# Patient Record
Sex: Female | Born: 1965
Health system: Southern US, Community
[De-identification: ages and names within clinical notes are randomized; demographics above are authoritative.]

## PROBLEM LIST (undated history)

## (undated) DIAGNOSIS — I10 Essential (primary) hypertension: Secondary | ICD-10-CM

## (undated) DIAGNOSIS — D869 Sarcoidosis, unspecified: Secondary | ICD-10-CM

## (undated) DIAGNOSIS — T7840XA Allergy, unspecified, initial encounter: Secondary | ICD-10-CM

## (undated) DIAGNOSIS — E039 Hypothyroidism, unspecified: Secondary | ICD-10-CM

## (undated) HISTORY — PX: KNEE ARTHROSCOPY: SUR90

## (undated) HISTORY — PX: THYROIDECTOMY: SHX17

## (undated) HISTORY — PX: ROTATOR CUFF REPAIR: SHX139

## (undated) HISTORY — PX: TONSILLECTOMY: SUR1361

---

## 1988-01-09 HISTORY — PX: TUBAL LIGATION: SHX77

## 2003-07-27 ENCOUNTER — Ambulatory Visit (HOSPITAL_COMMUNITY): Admission: RE | Admit: 2003-07-27 | Discharge: 2003-07-27 | Payer: Self-pay | Admitting: Family Medicine

## 2003-10-07 ENCOUNTER — Ambulatory Visit (HOSPITAL_COMMUNITY): Admission: RE | Admit: 2003-10-07 | Discharge: 2003-10-07 | Payer: Self-pay | Admitting: Endocrinology

## 2003-10-07 ENCOUNTER — Encounter (INDEPENDENT_AMBULATORY_CARE_PROVIDER_SITE_OTHER): Payer: Self-pay | Admitting: Specialist

## 2004-01-07 ENCOUNTER — Ambulatory Visit (HOSPITAL_COMMUNITY): Admission: RE | Admit: 2004-01-07 | Discharge: 2004-01-07 | Payer: Self-pay | Admitting: Endocrinology

## 2004-01-13 ENCOUNTER — Encounter: Admission: RE | Admit: 2004-01-13 | Discharge: 2004-04-12 | Payer: Self-pay | Admitting: Endocrinology

## 2004-03-14 ENCOUNTER — Observation Stay (HOSPITAL_COMMUNITY): Admission: RE | Admit: 2004-03-14 | Discharge: 2004-03-15 | Payer: Self-pay | Admitting: Surgery

## 2004-03-14 ENCOUNTER — Encounter (INDEPENDENT_AMBULATORY_CARE_PROVIDER_SITE_OTHER): Payer: Self-pay | Admitting: Specialist

## 2004-05-30 ENCOUNTER — Encounter: Admission: RE | Admit: 2004-05-30 | Discharge: 2004-08-28 | Payer: Self-pay | Admitting: Endocrinology

## 2005-04-05 ENCOUNTER — Ambulatory Visit: Payer: Self-pay | Admitting: Pulmonary Disease

## 2005-04-09 ENCOUNTER — Ambulatory Visit: Payer: Self-pay | Admitting: Pulmonary Disease

## 2005-04-20 ENCOUNTER — Ambulatory Visit: Payer: Self-pay | Admitting: Emergency Medicine

## 2005-05-21 ENCOUNTER — Ambulatory Visit: Payer: Self-pay | Admitting: Pulmonary Disease

## 2005-05-24 ENCOUNTER — Encounter (INDEPENDENT_AMBULATORY_CARE_PROVIDER_SITE_OTHER): Payer: Self-pay | Admitting: *Deleted

## 2005-05-24 ENCOUNTER — Ambulatory Visit: Payer: Self-pay | Admitting: Pulmonary Disease

## 2005-05-24 ENCOUNTER — Ambulatory Visit (HOSPITAL_COMMUNITY): Admission: RE | Admit: 2005-05-24 | Discharge: 2005-05-24 | Payer: Self-pay | Admitting: Pulmonary Disease

## 2005-06-19 ENCOUNTER — Ambulatory Visit: Payer: Self-pay | Admitting: Pulmonary Disease

## 2005-06-27 ENCOUNTER — Ambulatory Visit: Payer: Self-pay | Admitting: Pulmonary Disease

## 2005-07-09 ENCOUNTER — Ambulatory Visit (HOSPITAL_BASED_OUTPATIENT_CLINIC_OR_DEPARTMENT_OTHER): Admission: RE | Admit: 2005-07-09 | Discharge: 2005-07-09 | Payer: Self-pay | Admitting: Pulmonary Disease

## 2005-07-19 ENCOUNTER — Ambulatory Visit: Payer: Self-pay | Admitting: Pulmonary Disease

## 2005-07-26 ENCOUNTER — Ambulatory Visit: Payer: Self-pay | Admitting: Pulmonary Disease

## 2005-10-04 ENCOUNTER — Ambulatory Visit: Payer: Self-pay | Admitting: Pulmonary Disease

## 2005-10-05 ENCOUNTER — Ambulatory Visit: Payer: Self-pay | Admitting: Cardiology

## 2006-01-08 HISTORY — PX: OTHER SURGICAL HISTORY: SHX169

## 2006-03-20 ENCOUNTER — Ambulatory Visit: Payer: Self-pay | Admitting: Pulmonary Disease

## 2006-04-18 ENCOUNTER — Ambulatory Visit: Payer: Self-pay | Admitting: Pulmonary Disease

## 2006-05-16 ENCOUNTER — Ambulatory Visit: Payer: Self-pay | Admitting: Pulmonary Disease

## 2006-10-28 DIAGNOSIS — G4710 Hypersomnia, unspecified: Secondary | ICD-10-CM | POA: Insufficient documentation

## 2006-10-28 DIAGNOSIS — J45909 Unspecified asthma, uncomplicated: Secondary | ICD-10-CM | POA: Insufficient documentation

## 2010-02-12 ENCOUNTER — Emergency Department (INDEPENDENT_AMBULATORY_CARE_PROVIDER_SITE_OTHER): Payer: Worker's Compensation

## 2010-02-12 ENCOUNTER — Emergency Department (HOSPITAL_BASED_OUTPATIENT_CLINIC_OR_DEPARTMENT_OTHER)
Admission: EM | Admit: 2010-02-12 | Discharge: 2010-02-12 | Disposition: A | Discharge status: Home / Self Care | Payer: Worker's Compensation | Attending: Emergency Medicine | Admitting: Emergency Medicine

## 2010-02-12 DIAGNOSIS — M25519 Pain in unspecified shoulder: Secondary | ICD-10-CM

## 2010-02-12 DIAGNOSIS — Y9389 Activity, other specified: Secondary | ICD-10-CM

## 2010-02-12 DIAGNOSIS — M545 Low back pain, unspecified: Secondary | ICD-10-CM

## 2010-02-12 DIAGNOSIS — I10 Essential (primary) hypertension: Secondary | ICD-10-CM | POA: Insufficient documentation

## 2010-02-12 DIAGNOSIS — M5137 Other intervertebral disc degeneration, lumbosacral region: Secondary | ICD-10-CM | POA: Insufficient documentation

## 2010-02-12 DIAGNOSIS — M51379 Other intervertebral disc degeneration, lumbosacral region without mention of lumbar back pain or lower extremity pain: Secondary | ICD-10-CM | POA: Insufficient documentation

## 2010-02-12 DIAGNOSIS — Y9269 Other specified industrial and construction area as the place of occurrence of the external cause: Secondary | ICD-10-CM

## 2010-04-22 ENCOUNTER — Emergency Department (INDEPENDENT_AMBULATORY_CARE_PROVIDER_SITE_OTHER): Payer: BLUE CROSS/BLUE SHIELD

## 2010-04-22 ENCOUNTER — Emergency Department (HOSPITAL_BASED_OUTPATIENT_CLINIC_OR_DEPARTMENT_OTHER)
Admission: EM | Admit: 2010-04-22 | Discharge: 2010-04-22 | Disposition: A | Discharge status: Nursing Home (Includes ICF) | Payer: BLUE CROSS/BLUE SHIELD | Source: Home / Self Care | Attending: Emergency Medicine | Admitting: Emergency Medicine

## 2010-04-22 ENCOUNTER — Ambulatory Visit (HOSPITAL_COMMUNITY)
Admission: EM | Admit: 2010-04-22 | Discharge: 2010-04-22 | Disposition: A | Discharge status: Home / Self Care | Payer: BLUE CROSS/BLUE SHIELD | Attending: Emergency Medicine | Admitting: Emergency Medicine

## 2010-04-22 DIAGNOSIS — Z7902 Long term (current) use of antithrombotics/antiplatelets: Secondary | ICD-10-CM | POA: Insufficient documentation

## 2010-04-22 DIAGNOSIS — M65839 Other synovitis and tenosynovitis, unspecified forearm: Secondary | ICD-10-CM | POA: Insufficient documentation

## 2010-04-22 DIAGNOSIS — I10 Essential (primary) hypertension: Secondary | ICD-10-CM | POA: Insufficient documentation

## 2010-04-22 DIAGNOSIS — L02519 Cutaneous abscess of unspecified hand: Secondary | ICD-10-CM | POA: Insufficient documentation

## 2010-04-22 DIAGNOSIS — E7800 Pure hypercholesterolemia, unspecified: Secondary | ICD-10-CM | POA: Insufficient documentation

## 2010-04-22 DIAGNOSIS — Z79899 Other long term (current) drug therapy: Secondary | ICD-10-CM | POA: Insufficient documentation

## 2010-04-22 DIAGNOSIS — L03019 Cellulitis of unspecified finger: Secondary | ICD-10-CM | POA: Insufficient documentation

## 2010-04-22 DIAGNOSIS — J029 Acute pharyngitis, unspecified: Secondary | ICD-10-CM | POA: Insufficient documentation

## 2010-04-22 DIAGNOSIS — E119 Type 2 diabetes mellitus without complications: Secondary | ICD-10-CM | POA: Insufficient documentation

## 2010-04-22 LAB — DIFFERENTIAL
Basophils Relative: 0 % (ref 0–1)
Eosinophils Absolute: 0.2 10*3/uL (ref 0.0–0.7)
Eosinophils Relative: 2 % (ref 0–5)
Lymphocytes Relative: 32 % (ref 12–46)
Monocytes Absolute: 0.7 10*3/uL (ref 0.1–1.0)
Monocytes Relative: 11 % (ref 3–12)
Neutro Abs: 3.8 10*3/uL (ref 1.7–7.7)
Neutrophils Relative %: 55 % (ref 43–77)

## 2010-04-22 LAB — CBC
HCT: 37.6 % (ref 36.0–46.0)
Hemoglobin: 13 g/dL (ref 12.0–15.0)
MCH: 29.1 pg (ref 26.0–34.0)
MCHC: 34.6 g/dL (ref 30.0–36.0)
MCV: 84.1 fL (ref 78.0–100.0)
Platelets: 315 10*3/uL (ref 150–400)
RBC: 4.47 MIL/uL (ref 3.87–5.11)
RDW: 12.7 % (ref 11.5–15.5)
WBC: 6.9 10*3/uL (ref 4.0–10.5)

## 2010-04-22 LAB — BASIC METABOLIC PANEL
BUN: 10 mg/dL (ref 6–23)
Calcium: 9.1 mg/dL (ref 8.4–10.5)
Chloride: 100 mEq/L (ref 96–112)
Creatinine, Ser: 0.4 mg/dL (ref 0.4–1.2)
GFR calc Af Amer: >60 mL/min (ref >60)
GFR calc non Af Amer: >60 mL/min (ref >60)
Glucose, Bld: 132 mg/dL — ABNORMAL HIGH (ref 70–99)
Potassium: 3.8 mEq/L (ref 3.5–5.1)
Sodium: 137 mEq/L (ref 135–145)

## 2010-04-22 LAB — GLUCOSE, CAPILLARY: Glucose-Capillary: 108 mg/dL — ABNORMAL HIGH (ref 70–99)

## 2010-04-22 LAB — SEDIMENTATION RATE: Sed Rate: 20 mm/hr (ref 0–22)

## 2010-04-25 LAB — CULTURE, ROUTINE-ABSCESS

## 2010-04-27 NOTE — Op Note (Signed)
[name], SMITHHART NO.:  [id number]  MEDICAL RECORD NO.:  [id number]           PATIENT TYPE:  E  LOCATION:  [facility]                         FACILITY:  [facility]  PHYSICIAN:  Betha Loa, MD        DATE OF BIRTH:  March 23, 1965  DATE OF PROCEDURE:  04/22/2010 DATE OF DISCHARGE:                              OPERATIVE REPORT   PREOPERATIVE DIAGNOSIS:  Left ring finger abscess.  POSTOPERATIVE DIAGNOSIS:  Left ring finger abscess.  PROCEDURE:  Irrigation and debridement of left ring finger dorsal abscess.  SURGEON:  Betha Loa, MD  ASSISTANT:  None.  ANESTHESIA:  General.  IV FLUIDS:  Per Anesthesia flow sheet.  ESTIMATED BLOOD LOSS:  Minimal.  COMPLICATIONS:  None.  SPECIMENS:  Cultures to micro.  TOURNIQUET TIME:  18 minutes.  DISPOSITION:  Stable to PACU.  INDICATIONS:  [name] is a 45 year old right-hand dominant African American female who yesterday noted a pimple on the dorsum of the left ring finger.  She states this popped and a piece of hair came out.  She began to have swelling and erythema and pain in the finger.  She presented to [facility] where she was given a dose of IV clindamycin.  She was transferred to me for further care.  On evaluation, she had a swollen and erythematous dorsum of the left ring finger.  She was very tender to palpation.  There is a small amount of redness into the hand.  The remainder of the finger was not swollen. She was not tender at the distal phalanx or middle phalanx.  She was very mildly tender volarly over the proximal phalanx.  Most of her tenderness was dorsal.  There was no volar swelling or erythema.  I discussed with [name] the nature of infections and recommended we go to the operating room for irrigation and debridement of the infection. Risks, benefits, and alternatives of surgery were discussed including risk of blood loss, infection, damage to nerves, vessels, tendons, ligaments,  and bone, failure of surgery, need for additional procedures, complications with wound healing, continued infection, and potential need for repeat irrigation and debridement.  She voiced understanding these risks and elected to proceed.  OPERATIVE COURSE:  After being identified preoperatively by myself, the patient and I agreed upon procedure and site of procedure.  The surgical site was marked.  The risks, benefits, and alternatives of surgery were reviewed and she wished to proceed.  Surgical consent had been signed. She was transported to the operating room and placed on the operating room table in supine position with left upper extremity on arm board. General anesthesia was induced by the anesthesiologist.  The left upper extremity was prepped and draped in normal sterile orthopedic fashion. A surgical pause was performed between surgeons, Anesthesia, and operating room staff and all were in agreement as to patient, procedure, and site of procedure.  Tourniquet at proximal aspect of the extremity was inflated to 250 mmHg after exsanguination of limb by gravity. Incision was made in a curvilinear fashion at the dorsum of the left ring finger on the ulnar side.  This was carried into the subcutaneous tissues.  There was a small pocket of purulence.  This was cultured by swab and tissue culture and sent to microbiology lab.  The wound was copiously irrigated with sterile saline.  A small rent was made in the extensor tendon and there was no purulence from underneath the extensor tendon.  The area underneath the tendon was copiously irrigated with sterile saline as well.  A 1000 mL of sterile saline total was used. Remainder of the wound looked very good.  The wound was packed with 0.25- inch iodoform packing.  It was then dressed with sterile Xeroform and 4x4s and wrapped with Kling and a Coban dressing lightly.  The tourniquet was deflated at 18 minutes.  The operative drapes were  broken down.  The patient was awoken from anesthesia safely.  She was transferred back to stretcher and taken to PACU in stable condition.  I will give her Percocet 5/325 one to two p.o. q.6 h p.r.n. pain, dispensed #50 and Bactrim DS one p.o. b.i.d. x1 week.  I will see her back in the office within the week for postoperative follow up.     Betha Loa, MD     KK/MEDQ  D:  04/22/2010  T:  04/23/2010  Job:  [id number]  Electronically Signed by Betha Loa  on 04/27/2010 05:25:12 PM

## 2010-04-27 NOTE — H&P (Signed)
[name], [name] NO.:  [id number]  MEDICAL RECORD NO.:  [id number]           PATIENT TYPE:  E  LOCATION:  [facility]                         FACILITY:  [facility]  PHYSICIAN:  Betha Loa, MD        DATE OF BIRTH:  [birth year]  DATE OF ADMISSION:  04/22/2010 DATE OF DISCHARGE:                             HISTORY & PHYSICAL   CHIEF COMPLAINT:  Left ring finger infection.  HISTORY:  [name] is a 45 year old right-hand dominant African American female who noted a pimple on her left ring finger dorsally yesterday.  She states the pimple popped and there was a hair in it that she pulled out.  She then began to have swelling and erythema and pain in the finger.  She has had no fevers, chills, or night sweats.  She went to [facility] this morning where she was evaluated and referred to me for further care.  Her tetanus is up to date within the last 5 years.  She was given a dose of clindamycin 600 mg IV Ancef at [facility].  ALLERGIES:  ASPIRIN, CODEINE, and PENICILLIN causes hives.  PAST MEDICAL HISTORY: 1. Diabetes. 2. Hypertension. 3. Hypercholesterolemia.  PAST SURGICAL HISTORY:  Abdominal abscess, thyroid, tonsils, tubal ligation, and ruptured disk of her L-spine.  FAMILY HISTORY:  Noncontributory.  MEDICATIONS:  Januvia, lovastatin, metformin, and ramipril.  SOCIAL HISTORY:  Tobacco none.  Alcohol none.  REVIEW SYSTEMS:  A 13-point review of systems negative.  PHYSICAL EXAMINATION:  VITAL SIGNS:  Temperature 98.4, pulse 78, respirations 18, and BP 124/86. GENERAL:  Alert and oriented x3. HEENT:  Head is normocephalic and atraumatic. NECK:  Supple.  Full range of motion. CHEST:  Regular rate and rhythm. LUNGS:  Clear to auscultation bilaterally. ABDOMEN:  Nontender and nondistended. EXTREMITIES:  Bilateral upper extremity are distally neurovascularly intact in radial, median, and ulnar nerve distributions.  She has  intact sensation and capillary refill in all fingertips.  She can flex and extend the IP joints of her thumbs and cross her fingers.  Right upper extremity is without wounds and no tenderness to palpation.  Left upper extremity, she has a small punctate wound at the dorsal aspect of the ulnar side of the ring finger.  There is swelling and erythema surrounding this.  She is very tender to palpation throughout the finger, but mostly dorsally at the level of the proximal phalanx.  She is not tender volarly at the middle phalanx or distal phalanx.  She has mild tenderness volarly at the proximal phalanx PIP joint and MP joint. This is much less significant than at the dorsal aspect of the proximal phalanx.  There is erythema and swelling dorsally.  There is no erythema and no swelling volarly.  Extension of the finger causes pain dorsally but not volarly.  She is able to flex and extend the digit some.  RADIOGRAPHS:  AP and lateral views of the ring finger show no fractures, dislocations, or radiopaque foreign bodies.  LABORATORY RESULTS:  White blood count 6.9, hemoglobin 13.0, hematocrit 37.6, and platelets 315.  Sodium 137, potassium 3.8, chloride  100, carbon dioxide 23, glucose 132, BUN 10, creatinine 0.4, and calcium 9.1.  ASSESSMENT AND PLAN:  Left ring finger dorsal abscess.  I discussed with [name] the nature of this condition.  I recommended going to the operating room for irrigation and debridement of the left ring finger. We discussed the potential for dorsal and volar abscesses though I think this is a dorsal abscess only.  I do not think the flexor tendon is involved.  It is a difficult to determine because she is somewhat hypersensitive to tenderness but most of her tenderness is dorsal.  I discussed the risks, benefits, and alternative surgery including risk of blood loss, infection, damage to nerves, vessels, tendons, ligaments, and bone, failure of surgery, need for  additional surgery, complications with wound healing, and potential need for repeat irrigation and debridement.  She voiced understanding these risks and elected to proceed.  We will have the surgery arranged as soon as possible.     Betha Loa, MD     KK/MEDQ  D:  04/22/2010  T:  04/23/2010  Job:  [id number]  Electronically Signed by Betha Loa  on 04/27/2010 05:23:54 PM

## 2010-04-28 LAB — ANAEROBIC CULTURE

## 2010-05-23 NOTE — Assessment & Plan Note (Signed)
[facility]                             PULMONARY OFFICE NOTE   NAME:[name], [name]                     MRN:          [id number]  DATE:05/16/2006                            DOB:          1965-10-10    PULMONARY FOLLOWUP VISIT   I saw [name] in followup today for her hypersomnia with upper airway  resistance syndrome and asthma.   With regard to her hypersomnia, she had undergone an auto CPAP titration  study from April 22 to March 5.  Her 90th percentile pressure setting  was 12, although it appeared that the majority of her time was spent on  a pressure setting of 9 or less.  Her apnea/hypopnea index was 5.8 and  she had an average daily usage of about 2 hours.  She says, however,  that on the nights that she was able to use it more, she did notice a  symptomatic benefit the following day and seemed to sleep better that  night.  She was provided with a nasal mask with heated humidification  and chin strap.  Her main difficulty was that when she would open her  mouth, she felt like she could not catch her breath, but otherwise, she  seemed to be tolerating it reasonably well, considering she has only had  it for a short period of time.  With regard to her asthma, she is  complaining of nasal congestion with post-nasal drip in association with  chest tightness.  She says symptoms have improved to some degree, but  she is still having to use her ProAir HFA approximately once or twice  per day, which she says does help.   CURRENT MEDICATIONS:  1. Synthroid 88 mcg daily.  2. Metformin 500 mg b.i.d.  3. Symbicort 160/4.5 two puffs b.i.d.  4. Xyzal 5 mg as needed.  5. ProAir HFA 2 puffs as needed.   PHYSICAL EXAM:  She is 172 pounds.  Temperature 98.8, blood pressure  128/90, heart rate 68, oxygen saturation 98% on room air.  HEENT:  She has a clear nasal discharge with boggy nasal mucosa.  There  is no erythema to the posterior pharynx.   There is no lymphadenopathy.  HEART:  S1, S2.  CHEST:  No wheezing.  ABDOMEN:  Soft and nontender.  EXTREMITIES:  No edema.   IMPRESSION:  1. Hypersomnia with upper airway resistance syndrome and obstructive      sleep apnea.  I would continue her on continuous positive airway      pressure at 9 cm water with heated humidification, as she did gain      symptomatic benefit from this, particularly in light of the fact      that she does also have a history of diabetes.  I have also      reemphasized to her the importance of diet, exercise, and weight      reduction.  2. Asthma.  I will continue her on Symbicort 160/4.5 two puffs b.i.d.      as well as her Xyzal as needed.  I will have her restart the  use of      her Nasonex nasal spray and I have instructed her on the use of      nasal irrigation.  I will also start her on Singulair 10 mg      nightly.  If she is still having difficulty after this, then she      may need to undergo laboratory assessment to determine if she has      allergies, as well as to determine her IgE level.   I will follow up with her in approximately 6 to 8 weeks.     Coralyn Helling, MD  Electronically Signed    VS/MedQ  DD: 05/16/2006  DT: 05/16/2006  Job #: [id number]

## 2010-05-26 NOTE — Op Note (Signed)
NAME:  [name], [name] NO.:  [id number]   MEDICAL RECORD NO.:  [id number]          PATIENT TYPE:  AMB   LOCATION:  ENDO                         FACILITY:  [facility]   PHYSICIAN:  Coralyn Helling, M.D.      DATE OF BIRTH:  01-Dec-1965   DATE OF PROCEDURE:  05/24/2005  DATE OF DISCHARGE:                                 OPERATIVE REPORT   INDICATIONS FOR PROCEDURE:  This is a 45 year old female who presented with  dyspnea on exertion and was found to have multiple nodules in the right  middle lobe and right lower lobe.   PREOPERATIVE DIAGNOSIS:  Rule out lung cancer, rule out sarcoid, rule out  granulomatous infection.   POSTOPERATIVE DIAGNOSIS:  Rule out lung cancer, rule out sarcoid, rule out  granulomatous infection.   The patient was brought to the bronchoscopy suite and she was premedicated  with a total of 6 mg of Versed and 100 mcg of fentanyl IV, topical lidocaine  and Cetacaine spray were instilled into the right nares and posterior  pharynx for topical anesthesia.  The bronchoscope was entered through the  right nares.  The vocal cords visualized and appeared normal.  Topical onset  lidocaine was instilled as needed for anesthesia.  The bronchoscope was  advanced to the trachea.  The carina was visualized and appeared normal.  The mucosa was normal.  The left main bronchus was then entered.  The left  upper lingular and lower lobes were all visualized.  There is no obvious  endobronchial lesions and mucosa appeared normal.  The bronchoscope was then  entered into the right main bronchus.  The right upper, middle and lower  lobes were all visualized.  There is no obvious endobronchial lesions and  mucosa appeared normal.  A total of 100 mL of saline was instilled into the  right middle lobe with approximately 39 mL returned.  Bronchial brushing was  then done from the right middle lobe, after which transbronchial biopsies  were obtained from the right middle lobe  using fluoroscopic guidance.  There  was a minimal amount of bleeding.  Otherwise the patient tolerated the  procedure well.  The airways were inspected quickly and there was no obvious  signs of bleeding.  The bronchoscope was then withdrawn, then the patient  was returned recovery room in stable condition.  The samples will be sent  for cytology, pathology and microbiology with AFB fungal cultures, Gram  stain and culture. I will call the patient with results of the bronchoscopy  and follow up with her in my office in 1-2 weeks.      Coralyn Helling, M.D.  Electronically Signed     VS/MEDQ  D:  05/24/2005  T:  05/25/2005  Job:  [id number]

## 2010-05-26 NOTE — Procedures (Signed)
NAME:  [name], [name] NO.:  [id number]   MEDICAL RECORD NO.:  [id number]          PATIENT TYPE:  OUT   LOCATION:  SLEEP CENTER                 FACILITY:  [facility]   PHYSICIAN:  Coralyn Helling, M.D.      DATE OF BIRTH:  [birth year]   DATE OF STUDY:  07/09/2005                              NOCTURNAL POLYSOMNOGRAM   INDICATIONS FOR STUDY:  This is an individual who has symptoms of excessive  daytime sleepiness and snoring and is referred to the sleep lab for  evaluation of sleep disorder breathing.   Epworth Score is 14.   Medications are Glipizide, Levoxyl, Nasonex, albuterol and Zegrid.   SLEEP ARCHITECTURE:  Total recording time was 430.5 minutes. Total sleep  time was 304.5 minutes.  Sleepless efficiency was 71% which is reduced.  Sleep latency was 69.5 minutes which is prolonged. REM latency was 116.5  minutes. The patient appeared to have difficulty with sleep initiation due  to respiratory events.  The patient was observed in all stages of sleep,  although there was a slight reduction in the percentage of REM sleep to 14%  of the study and the patient was observed in both supine and non-supine  position.   RESPIRATORY DATA:  The respiratory disturbance index was 3.  The events were  exclusively obstructive in nature.  The average respiratory rate was 16.  Loud snoring was noted by the technician. Also of note is that the patient  appeared to have truncation of the pressure transducer associated with  arousals which would be suggestive of upper airway resistance syndrome.   OXYGEN DATA:  The oxygen saturation nadir was 90%. The baseline oxygenation  was 98%.  The patient spent the entire study with the oxygen saturation  between 90-100%.   CARDIAC DATA:  The average heart rate was 79.  An EKG showed normal sinus  rhythm.   MOVEMENT PARASOMNIA:  The periodic limb movement index was 5.5.   IMPRESSION:  The overall respiratory disturbance index for the study was  3  with an oxygen saturation nadir of 90%.  This does not qualify for a  diagnosis of obstructive sleep apnea.  However, it was noted that the  patient did have truncation of her air flow associated with arousals which  would be suggestive of upper airway resistance  syndrome.  If the patient is symptomatic with regards to this, then  consideration could be given to undergoing CPAP therapy to see if this  provides symptomatic relief.      Coralyn Helling, M.D.  Diplomat, Biomedical engineer of Sleep Medicine  Electronically Signed     VS/MEDQ  D:  07/20/2005 15:11:31  T:  07/20/2005 17:49:33  Job:  [id number]

## 2010-05-26 NOTE — Assessment & Plan Note (Signed)
[facility]                             PULMONARY OFFICE NOTE   NAME:[name], [name] VALTIERRA                     MRN:          [id number]  DATE:04/18/2006                            DOB:          May 11, 1965    I saw [name] in followup for her sleep disturbance with hypersomnia,  as well as her respiratory difficulties.   She was originally scheduled to have an auto CPAP titration study for 2  weeks, but apparently when the home care company came, they told her  that she needed to pay them 500 dollars that day, otherwise they would  not supply her with her equipment.  As a result, [name] was not able  to the titration study done.  She said that she did use the Ambien, and  this seemed to help her fall asleep, but I had explained to her that was  not the primary intent of the Ambien.  She says that she has also  developed worsening symptoms of cough, chest tightness, and wheezing.  She says she wakes up with the feeling that her throat is clogged.  She  also gets these difficulties with her breathing during the day.  She has  used her Pro-Air inhaler, and she said that this helps initially, but  then she gets her symptoms back again.  She said she is using a Pro-Air  inhaler 2 or 3 times a week at least, if not more often.  She is not  having much as far as sinus symptoms.  She is not having much as far as  reflux symptoms either.   Her medication list was reviewed.   PHYSICAL EXAMINATION:  She is 170 pounds.  Temperature is 98.3.  Blood  pressure is 114/88.  Heart rate of 75.  Oxygen saturation 98% on room  air.  HEENT:  There is no sinus tenderness.  No nasal discharge.  No oral  lesions.  No lymphadenopathy.  HEART:  S1 and S2.  CHEST:  There was faint wheezing heard at the bases.  ABDOMEN:  Obese, soft, non-tender.  EXTREMITIES:  No edema.   IMPRESSION:  1. Sleep disturbance with hypersomnia and findings of upper airway      resistance  syndrome on her overnight polysomnogram.  We will refer      her to a different home care company to see if she can be set up      for an auto CPAP titration study for 2 weeks to determine if she      does gain any symptomatic benefit from this.  She says that she has      also started an exercise program.  I have encouraged her to      continue with this with the hopes that if she is able to lose a      significant amount of weight, this could obviate the need for any      further interventions with her upper airway resistance syndrome.  2. Asthma without adequate control.  I have given her a sample and  prescription for Symbicort 160/4.5 two puffs b.i.d.  I have also      given her a sample and prescription for Pro-Air HFA, which she is      to use 2 puffs q.i.d. p.r.n.  I have also given her a sample and      prescription for Xyzal 5 mg once daily as needed.  I will have her      undergo a chest x-ray in my office.   I plan to follow up with her in approximately 4 weeks.  I have advised  her to call me if her symptoms do not improve with the above  interventions.     Coralyn Helling, MD  Electronically Signed    VS/MedQ  DD: 04/18/2006  DT: 04/18/2006  Job #: [id number]

## 2010-05-26 NOTE — Assessment & Plan Note (Signed)
[name]                               PULMONARY OFFICE NOTE   NAME:[name]                     MRN:          [id number]  DATE:07/26/2005                            DOB:          1965-04-05    I saw [name] in followup today for her dyspnea and abnormal chest x-ray.  She says that her symptoms of coughing and reflux have improved  significantly with her regime of nasal steroid with Nasonex, Zegrid and  p.r.n. albuterol.  She says she is using the albuterol about twice a week.  Additionally, her bronchoscopy biopsy results showed a slight prominence of  histocytes but then further staining was negative and culture results were  negative.  She had also undergone an overnight polysomnogram on June 09, 2005  which showed an apnea/hypopnea index of 3 and oxygen saturation of 90%, but  there was evidence for upper airway resistant syndrome.  She does not have  any other significant change in her health and medications since her last  visit.   PHYSICAL EXAMINATION:  GENERAL:  She is 173 pounds.  Temperature is 98.2.  Blood pressure is 120/83.  Heart rate is 88.  Oxygen saturation is 90% on  room air.  HEENT:  Pupils are reactive.  There is no sinus tenderness.  She had a  decreased erythema of the nasal mucosa as well as decreased erythema of the  posterior pharynx.  There is no lymphadenopathy, no thyromegaly.  HEART:  S1, S2.  CHEST:  No wheezing or rales.  ABDOMEN:  Soft, non-tender.  EXTREMITIES:  No edema.   IMPRESSION:  1.  Dyspnea with cough and abnormal computed tomography scan.  At this time      I will continue her on her regimen of  Nasonex , Zegerid and as needed      albuterol as she does seem to be gaining symptomatic benefit from this.      I have also discussed with her the importance of diet, exercise and      weight reduction to try to improve some of these symptoms as well.  2.  Upper airway insufficiency syndrome.  I  have discussed with her that      while this may contribute to her symptoms of sleep disruption and      daytime hypersomnolence - the correlation with adverse consequences is      less clear.  I reviewed several options with her for doing a __________      with this.  She says this time she would not wish to undergo any of      these, but would prefer to try doing a diet and exercise regimen at      first.  3.  Abnormal computerized tomography.  The bronchoscopy biopsy results were      essentially negative with the exception of the slight increase in the      amount of histiocytes seen on the surgical biopsy specimen - but again      the special stains were negative.  At this time I would  have her undergo      a repeat computerized tomography of her chest in approximately two      months to be certain that there is no progression of any ongoing      disease. I plan on following up with her after her computerized      tomography.                                   Coralyn Helling, MD   VS/MedQ  DD:  07/26/2005  DT:  07/26/2005  Job #:  [id number]

## 2010-05-26 NOTE — Op Note (Signed)
[name], [name]              ACCOUNT NO.:  [id number]   MEDICAL RECORD NO.:  [id number]          PATIENT TYPE:  AMB   LOCATION:  DAY                          FACILITY:  [facility]   PHYSICIAN:  Velora Heckler, MD      DATE OF BIRTH:  22-Apr-1965   DATE OF PROCEDURE:  03/14/2004  DATE OF DISCHARGE:                                 OPERATIVE REPORT   PREOPERATIVE DIAGNOSES:  1.  Thyroid goiter, follicular lesion right lobe.  2.  Hyperthyroidism.   POSTOPERATIVE DIAGNOSES:  1.  Thyroid goiter, follicular lesion right lobe.  2.  Hyperthyroidism.   PROCEDURE:  Total thyroidectomy.   SURGEON:  Velora Heckler, MD   ASSISTANT:  Gita Kudo, M.D.   ANESTHESIA:  General.   ESTIMATED BLOOD LOSS:  Minimal.   PREPARATION:  Betadine.   COMPLICATIONS:  None.   INDICATIONS:  The patient is a 45 year old black female, referred by Dr.  Dorisann Frames, with thyroid goiter with dominant right-sided nodule.  Fine-  needle aspiration showed this to be a follicular lesion.  The patient is  borderline hyperthyroid.  She now comes to surgery for resection.   BODY OF REPORT:  The procedure is done in OR #4 at the [facility].  The patient was brought to the operating room and placed in a  supine position on the operating room table.  Following administration of  general anesthesia, the patient is positioned and then prepped and draped in  the usual strict aseptic fashion.  After ascertaining that an adequate level  of anesthesia had been obtained, a Kocher incision is made with the #15  blade.  Dissection is carried through subcutaneous tissues and platysma.  Hemostasis is obtained with the electrocautery.  Skin flaps are developed  cephalad and caudad from the thyroid notch to the sternal notch.  A Mahorner  self-retaining retractor is placed for exposure.  Strap muscles are incised  in the midline.  Dissection was begun on the right side.  Strap muscles are  reflected  laterally.  The right lobe is markedly enlarged.  There is a very  large middle thyroid vein emptying immediately into the jugular vein.  This  is ligated in continuity with 3-0 silk ties and medium Ligaclips, then  divided.  The inferior pole was mobilized.  The superior pole was dissected  out.  The superior pole vessels are ligated in continuity with 2-0 silk ties  and medium Ligaclips, then divided.  The superior parathyroid gland was  identified and preserved.  Inferior venous tributaries are divided between  medium Ligaclips.  The gland is rolled anteriorly.  Branches of the inferior  thyroid artery are divided between small Ligaclips.  Recurrent laryngeal  nerve is identified and preserved.  Ligament of Allyson Sabal is transected using  the electrocautery for hemostasis.  The gland is rolled up and onto the  anterior surface of the trachea.   Next, we turned our attention to the left lobe. The left lobe is enlarged,  but not as significantly as the right side.  Again, strap muscles are  reflected laterally.  Again, there is a large middle thyroid vein emptying  immediately into the jugular vein.  This is, again, ligated in continuity  with 3-0 silk ties and medium Ligaclips, then divided.  The inferior venous  tributaries are ligated with 3-0 silk ties.  Superior pole was dissected  out.  Superior pole vessels are ligated in continuity with 2-0 silk ties and  medium Ligaclips, then divided.  The gland is rolled anteriorly.  Again,  parathyroid tissue was identified and preserved in the superior location.  Recurrent laryngeal nerve was identified and preserved.  Branches of the  inferior thyroid artery are divided between small Ligaclips.  Ligament of  Allyson Sabal is transected with the electrocautery; the gland is rolled onto the  anterior trachea.  It is excised off the anterior trachea using the  electrocautery for hemostasis.  Suture is used to mark the left superior  pole.  The entire  thyroid gland is submitted to pathology for review. Good  hemostasis is noted in both sides of the neck.  Surgicel was placed over the  area of the recurrent laryngeal nerves bilaterally.  Strap muscles are  reapproximated in the midline with interrupted 3-0 Vicryl sutures.  Platysma  is closed with interrupted 3-0 Vicryl sutures.  Skin is closed with a  running 4-0 Vicryl subcuticular suture.  The wound is washed and dried, and  Benzoin and Steri-Strips are applied.  Sterile dressings are applied.  The  patient is awakened from anesthesia and brought to the recovery room in  stable condition.  The patient tolerated the procedure well.      TMG/MEDQ  D:  03/14/2004  T:  03/14/2004  Job:  [id number]   cc:   Dorisann Frames, M.D.  [facility] [place] [address]  Fax: [phone]   Deatra James, M.D.  Fax: [phone]

## 2010-05-26 NOTE — Assessment & Plan Note (Signed)
[facility]                             PULMONARY OFFICE NOTE   NAME:[name], [name] BUZZELL                     MRN:          [id number]  DATE:03/20/2006                            DOB:          1965/11/01    I saw Ms. [name] today for evaluation of her difficulties with sleep.  She says that for the last 2 weeks she can not fall asleep or stay  asleep.  She says actually that she is able to fall asleep, but then she  will often times wake herself up with vivid dreams and then she is up  and down all night.  She says that she will have a lot of thoughts about  her difficulty falling asleep as well as how this will affect her during  the day.  She does look at the clock quite frequently.  She says that  she will fall asleep however sometimes while watching TV prior to going  to sleep.  She is trying to go to sleep between 10 and 11 and gets up at  about 6 in the morning but still feels quite tired, as well as having  these symptoms of tiredness persist through the day.  She has tried  taking Tylenol PM but did not notice any benefit from this.  She was  started on Synthroid at the beginning of February as well as being  started on Metformin at the beginning of February.   CURRENT MEDICATIONS:  1. Nasonex 2 sprays in each nostril p.r.n.  2. Synthroid 88 mcg daily.  3. Metformin 500 mg b.i.d.   PHYSICAL EXAMINATION:  She is 172 pounds, temperature is 98.5, blood  pressure is 120/74, heart rate is 82, oxygen saturation is 100%.  HEENT:  There is sinusitis, no nasal discharge, she has a Mallampati IV  airway, there is no lymphadenopathy.  HEART:  With S1, S2.  CHEST:  No wheezing or rales.  ABDOMEN:  Obese, soft, and tender.  EXTREMITIES:  There is no edema.   IMPRESSION:  1. Sleep disturbance with hypersomnia.  She did have the previous      sleep study which showed evidence for upper airway resistance      syndrome.  I again am concerned that she may  have some degree of      sleep disorder breathing which could be contributing to her      symptoms.  What I have asked her to do is to keep a sleep log for      the next few weeks.  I have also given her a prescription for      Ambien to see if his will help her to consolidate her sleep      pattern.  I have also made arrangements for her to have an auto      CPAP titration study for 2 weeks to determine if she can gain      symptomatic benefit from this, and if she does she may need to be      on CPAP therapy long term.  2. I have also discussed with  her rescheduling her followup computed      tomography scan of her chest to further evaluate the abnormalities      previously seen.   I will follow up with her in approximately 3 weeks.     Coralyn Helling, MD  Electronically Signed    VS/MedQ  DD: 03/20/2006  DT: 03/22/2006  Job #: [id number]

## 2010-08-13 ENCOUNTER — Encounter: Payer: Self-pay | Admitting: *Deleted

## 2010-08-13 ENCOUNTER — Emergency Department (HOSPITAL_BASED_OUTPATIENT_CLINIC_OR_DEPARTMENT_OTHER)
Admission: EM | Admit: 2010-08-13 | Discharge: 2010-08-13 | Disposition: A | Discharge status: Home / Self Care | Payer: BC Managed Care – PPO | Attending: Emergency Medicine | Admitting: Emergency Medicine

## 2010-08-13 ENCOUNTER — Emergency Department (INDEPENDENT_AMBULATORY_CARE_PROVIDER_SITE_OTHER): Payer: BC Managed Care – PPO

## 2010-08-13 DIAGNOSIS — S99929A Unspecified injury of unspecified foot, initial encounter: Secondary | ICD-10-CM

## 2010-08-13 DIAGNOSIS — IMO0002 Reserved for concepts with insufficient information to code with codable children: Secondary | ICD-10-CM

## 2010-08-13 DIAGNOSIS — S8990XA Unspecified injury of unspecified lower leg, initial encounter: Secondary | ICD-10-CM

## 2010-08-13 DIAGNOSIS — E119 Type 2 diabetes mellitus without complications: Secondary | ICD-10-CM | POA: Insufficient documentation

## 2010-08-13 DIAGNOSIS — Y92009 Unspecified place in unspecified non-institutional (private) residence as the place of occurrence of the external cause: Secondary | ICD-10-CM | POA: Insufficient documentation

## 2010-08-13 DIAGNOSIS — S9031XA Contusion of right foot, initial encounter: Secondary | ICD-10-CM

## 2010-08-13 DIAGNOSIS — W2209XA Striking against other stationary object, initial encounter: Secondary | ICD-10-CM | POA: Insufficient documentation

## 2010-08-13 DIAGNOSIS — S9030XA Contusion of unspecified foot, initial encounter: Secondary | ICD-10-CM | POA: Insufficient documentation

## 2010-08-13 MED ORDER — TRAMADOL HCL 50 MG PO TABS: 50.0000 mg | ORAL_TABLET | Freq: Four times a day (QID) | ORAL | Status: AC | PRN

## 2010-08-13 MED ORDER — TRAMADOL HCL 50 MG PO TABS
50.0000 mg | ORAL_TABLET | Freq: Once | ORAL | Status: AC
  Administered 2010-08-13: 50 mg via ORAL
  Filled 2010-08-13: qty 1

## 2010-08-13 NOTE — ED Provider Notes (Signed)
History     CSN: [id number] Arrival date & time: 08/13/2010  5:44 AM  Chief Complaint  Patient presents with  . Toe Injury   HPI Comments: Patient reports that last night while she was walking down the hallway and across the doorway that she hit her right foot on something. Since that time she's had a throbbing type pain. It woke her up from sleep this morning which is why she presents to emergency department now. The pain is located over her lateral right foot. She is able to bear weight on this foot.  The history is provided by the patient.    Past Medical History  Diagnosis Date  . Diabetes mellitus     Past Surgical History  Procedure Date  . Thyroidectomy     No family history on file.  History  Substance Use Topics  . Smoking status: Not on file  . Smokeless tobacco: Not on file  . Alcohol Use:     OB History    Grav Para Term Preterm Abortions TAB SAB Ect Mult Living                  Review of Systems  Constitutional: Negative.  Negative for fever and chills.  HENT: Negative.   Eyes: Negative.  Negative for discharge and redness.  Respiratory: Negative.  Negative for cough and shortness of breath.   Cardiovascular: Negative.  Negative for chest pain.  Gastrointestinal: Negative.  Negative for nausea, vomiting, abdominal pain and diarrhea.  Genitourinary: Negative.  Negative for dysuria and vaginal discharge.  Musculoskeletal: Negative.  Negative for back pain.  Skin: Negative.  Negative for color change and rash.  Neurological: Negative.  Negative for syncope and headaches.  Hematological: Negative.  Negative for adenopathy.  Psychiatric/Behavioral: Negative.  Negative for confusion.  All other systems reviewed and are negative.    Physical Exam  BP 144/81  Pulse 74  Temp 98.2 F (36.8 C)  Resp 18  SpO2 100%  Physical Exam  Constitutional: She is oriented to person, place, and time. She appears well-developed and well-nourished.  HENT:  Head:  Normocephalic and atraumatic.  Eyes: Conjunctivae and EOM are normal. Pupils are equal, round, and reactive to light.  Neck: Normal range of motion. Neck supple.  Cardiovascular: Normal rate.   Pulmonary/Chest: Effort normal.  Neurological: She is alert and oriented to person, place, and time.       Right foot has no swelling, erythema, warmth. She has some nonfocal tenderness over the lateral aspect of her foot over the fourth and fifth metatarsals and toes. She can flex and extend her ankle without difficulty. Capillary refill is less than 2 seconds. She has a palpable DP pulse.  Skin: Skin is warm and dry. No rash noted. No erythema.  Psychiatric: She has a normal mood and affect. Her behavior is normal. Judgment and thought content normal.    ED Course  Procedures  MDM  Dg Foot Complete Right  08/13/2010  *RADIOLOGY REPORT*  Clinical Data: Lateral foot pain, injury.  RIGHT FOOT COMPLETE - 3+ VIEW  Comparison: None.  Findings: No acute bony abnormality.  Specifically, no fracture, subluxation, or dislocation.  Soft tissues are intact.  IMPRESSION: No acute bony abnormality.  Original Report Authenticated By: Cyndie Chime, M.D.     Patient has no acute fractures noted on her x-ray. She is able to ambulate and bear weight on her foot with no signs of obvious infection. She has no lacerations or wounds.  Patient will be given instructions for pain medicine and ice and elevation as needed and followup with her primary care physician later this week.    Nat Christen, MD 08/13/10 [phone]

## 2010-11-26 ENCOUNTER — Encounter (HOSPITAL_BASED_OUTPATIENT_CLINIC_OR_DEPARTMENT_OTHER): Payer: Self-pay | Admitting: Emergency Medicine

## 2010-11-26 ENCOUNTER — Emergency Department (HOSPITAL_BASED_OUTPATIENT_CLINIC_OR_DEPARTMENT_OTHER)
Admission: EM | Admit: 2010-11-26 | Discharge: 2010-11-26 | Disposition: A | Discharge status: Home / Self Care | Payer: BC Managed Care – PPO | Attending: Emergency Medicine | Admitting: Emergency Medicine

## 2010-11-26 DIAGNOSIS — I10 Essential (primary) hypertension: Secondary | ICD-10-CM | POA: Insufficient documentation

## 2010-11-26 DIAGNOSIS — J029 Acute pharyngitis, unspecified: Secondary | ICD-10-CM

## 2010-11-26 DIAGNOSIS — E119 Type 2 diabetes mellitus without complications: Secondary | ICD-10-CM | POA: Insufficient documentation

## 2010-11-26 HISTORY — DX: Essential (primary) hypertension: I10

## 2010-11-26 LAB — RAPID STREP SCREEN (MED CTR MEBANE ONLY): Streptococcus, Group A Screen (Direct): NEGATIVE

## 2010-11-26 MED ORDER — AZITHROMYCIN 250 MG PO TABS: 250.0000 mg | ORAL_TABLET | Freq: Every day | ORAL | Status: AC

## 2010-11-26 MED ORDER — OXYCODONE-ACETAMINOPHEN 5-325 MG PO TABS: 1.0000 | ORAL_TABLET | ORAL | Status: AC | PRN

## 2010-11-26 MED ORDER — OXYCODONE-ACETAMINOPHEN 5-325 MG PO TABS
1.0000 | ORAL_TABLET | Freq: Once | ORAL | Status: AC
  Administered 2010-11-26: 1 via ORAL
  Filled 2010-11-26: qty 1

## 2010-11-26 NOTE — ED Provider Notes (Signed)
History     CSN: [id number] Arrival date & time: 11/26/2010 11:09 AM   First MD Initiated Contact with Patient 11/26/10 1139      Chief Complaint  Patient presents with  . Sore Throat    (Consider location/radiation/quality/duration/timing/severity/associated sxs/prior treatment) HPI Patient with sore throat for two days.  No known fever.  Pain is burning and worsens with swallowing.  Pain is worsening.  Patient with some cough and chest wall pain with coughing.  No rhinnorhea.  Some diarrhea, no vomiting.  BS is in the 200.     Past Medical History  Diagnosis Date  . Diabetes mellitus   . Hypertension     Past Surgical History  Procedure Date  . Thyroidectomy     No family history on file.  History  Substance Use Topics  . Smoking status: Never Smoker   . Smokeless tobacco: Not on file  . Alcohol Use: No    OB History    Grav Para Term Preterm Abortions TAB SAB Ect Mult Living                  Review of Systems  All other systems reviewed and are negative.    Allergies  Aspirin; Codeine; and Penicillins  Home Medications   Current Outpatient Rx  Name Route Sig Dispense Refill  . SITAGLIPTIN PHOSPHATE 25 MG PO TABS Oral Take 25 mg by mouth daily.      Marland Kitchen LEVOTHYROXINE SODIUM 100 MCG PO TABS Oral Take 100 mcg by mouth daily.      Marland Kitchen METFORMIN HCL ER (MOD) 1000 MG PO TB24 Oral Take 1,000 mg by mouth daily with breakfast.      . SIMVASTATIN 10 MG PO TABS Oral Take 10 mg by mouth at bedtime.        BP 142/89  Pulse 80  Temp(Src) 98.1 F (36.7 C) (Oral)  Resp 18  SpO2 100%  Physical Exam  Vitals reviewed. Constitutional: She appears well-developed and well-nourished.  HENT:  Head: Normocephalic and atraumatic.  Right Ear: External ear normal.  Left Ear: External ear normal.  Nose: Nose normal.  Mouth/Throat: Oropharynx is clear and moist.  Eyes: Conjunctivae and EOM are normal. Pupils are equal, round, and reactive to light.  Neck: Normal range  of motion. Neck supple.  Cardiovascular: Normal rate.   Pulmonary/Chest: Effort normal and breath sounds normal.  Abdominal: Soft. Bowel sounds are normal.  Musculoskeletal: Normal range of motion.  Lymphadenopathy:    She has cervical adenopathy.  Neurological: She is alert. She has normal reflexes.  Skin: Skin is warm and dry.  Psychiatric: She has a normal mood and affect.    ED Course  Procedures (including critical care time)  Labs Reviewed - No data to display No results found.   No diagnosis found.    MDM          Hilario Quarry, MD 11/26/10 [phone]

## 2010-11-26 NOTE — ED Notes (Signed)
Pt c/o sore throat & cough since Friday afternoon.

## 2012-11-13 ENCOUNTER — Encounter (HOSPITAL_BASED_OUTPATIENT_CLINIC_OR_DEPARTMENT_OTHER): Payer: Self-pay | Admitting: Emergency Medicine

## 2012-11-13 ENCOUNTER — Emergency Department (HOSPITAL_BASED_OUTPATIENT_CLINIC_OR_DEPARTMENT_OTHER)
Admission: EM | Admit: 2012-11-13 | Discharge: 2012-11-13 | Disposition: A | Discharge status: Home / Self Care | Payer: BC Managed Care – PPO | Attending: Emergency Medicine | Admitting: Emergency Medicine

## 2012-11-13 DIAGNOSIS — Z79899 Other long term (current) drug therapy: Secondary | ICD-10-CM | POA: Insufficient documentation

## 2012-11-13 DIAGNOSIS — Z880 Allergy status to penicillin: Secondary | ICD-10-CM | POA: Insufficient documentation

## 2012-11-13 DIAGNOSIS — T7840XA Allergy, unspecified, initial encounter: Secondary | ICD-10-CM

## 2012-11-13 DIAGNOSIS — R21 Rash and other nonspecific skin eruption: Secondary | ICD-10-CM | POA: Insufficient documentation

## 2012-11-13 DIAGNOSIS — E119 Type 2 diabetes mellitus without complications: Secondary | ICD-10-CM | POA: Insufficient documentation

## 2012-11-13 DIAGNOSIS — I10 Essential (primary) hypertension: Secondary | ICD-10-CM | POA: Insufficient documentation

## 2012-11-13 DIAGNOSIS — L299 Pruritus, unspecified: Secondary | ICD-10-CM | POA: Insufficient documentation

## 2012-11-13 DIAGNOSIS — R220 Localized swelling, mass and lump, head: Secondary | ICD-10-CM | POA: Insufficient documentation

## 2012-11-13 MED ORDER — FAMOTIDINE IN NACL 20-0.9 MG/50ML-% IV SOLN
20.0000 mg | Freq: Once | INTRAVENOUS | Status: AC
  Administered 2012-11-13: 20 mg via INTRAVENOUS
  Filled 2012-11-13: qty 50

## 2012-11-13 MED ORDER — HYDROXYZINE HCL 25 MG PO TABS: 25.0000 mg | ORAL_TABLET | Freq: Four times a day (QID) | ORAL | Status: DC

## 2012-11-13 MED ORDER — DIPHENHYDRAMINE HCL 50 MG/ML IJ SOLN
25.0000 mg | Freq: Once | INTRAMUSCULAR | Status: AC
  Administered 2012-11-13: 25 mg via INTRAVENOUS
  Filled 2012-11-13: qty 1

## 2012-11-13 MED ORDER — METHYLPREDNISOLONE SODIUM SUCC 125 MG IJ SOLR
125.0000 mg | Freq: Once | INTRAMUSCULAR | Status: AC
  Administered 2012-11-13: 125 mg via INTRAVENOUS
  Filled 2012-11-13: qty 2

## 2012-11-13 MED ORDER — FAMOTIDINE 20 MG PO TABS: 20.0000 mg | ORAL_TABLET | Freq: Two times a day (BID) | ORAL | Status: DC

## 2012-11-13 NOTE — ED Notes (Signed)
Pt now states having some chest tightness,  Onset last pm when face started swelling

## 2012-11-13 NOTE — ED Notes (Signed)
C/o facial swelling , lip and eyes swelling onset yesterday after eating 2 peanuts,  States las pm has some diff breathing,  At present no diff breathing or talking,  Swelling has gone down some

## 2012-11-13 NOTE — ED Provider Notes (Signed)
CSN: [id number]     Arrival date & time 11/13/12  1140 History   First MD Initiated Contact with Patient 11/13/12 1217     Chief Complaint  Patient presents with  . Facial Swelling   (Consider location/radiation/quality/duration/timing/severity/associated sxs/prior Treatment) Patient is a 47 y.o. female presenting with allergic reaction. The history is provided by the patient. No language interpreter was used.  Allergic Reaction Presenting symptoms: itching, rash and swelling   Presenting symptoms: no difficulty breathing   Rash:    Location:  Full body   Quality: itchiness     Severity:  Mild   Onset quality:  Sudden   Timing:  Constant   Progression:  Resolved Severity:  Moderate Prior allergic episodes:  No prior episodes Relieved by:  Nothing Worsened by:  Nothing tried Ineffective treatments:  None tried   Past Medical History  Diagnosis Date  . Diabetes mellitus   . Hypertension    Past Surgical History  Procedure Laterality Date  . Thyroidectomy     History reviewed. No pertinent family history. History  Substance Use Topics  . Smoking status: Never Smoker   . Smokeless tobacco: Not on file  . Alcohol Use: No   OB History   Grav Para Term Preterm Abortions TAB SAB Ect Mult Living                 Review of Systems  Skin: Positive for itching and rash.  All other systems reviewed and are negative.    Allergies  Aspirin; Codeine; and Penicillins  Home Medications   Current Outpatient Rx  Name  Route  Sig  Dispense  Refill  . famotidine (PEPCID) 20 MG tablet   Oral   Take 1 tablet (20 mg total) by mouth 2 (two) times daily.   10 tablet   0   . hydrOXYzine (ATARAX/VISTARIL) 25 MG tablet   Oral   Take 1 tablet (25 mg total) by mouth every 6 (six) hours.   12 tablet   0   . levothyroxine (SYNTHROID, LEVOTHROID) 100 MCG tablet   Oral   Take 100 mcg by mouth daily.           . metFORMIN (GLUMETZA) 1000 MG (MOD) 24 hr tablet   Oral   Take  1,000 mg by mouth daily with breakfast.           . simvastatin (ZOCOR) 10 MG tablet   Oral   Take 10 mg by mouth at bedtime.           . sitaGLIPtin (JANUVIA) 25 MG tablet   Oral   Take 25 mg by mouth daily.            BP 126/79  Pulse 76  Temp(Src) 98.2 F (36.8 C) (Oral)  Resp 18  Ht 5\' 4"  (1.626 m)  Wt 162 lb (73.483 kg)  BMI 27.79 kg/m2  SpO2 100% Physical Exam  Nursing note and vitals reviewed. Constitutional: She is oriented to person, place, and time. She appears well-developed and well-nourished.  HENT:  Head: Normocephalic and atraumatic.  Right Ear: External ear normal.  Left Ear: External ear normal.  Nose: Nose normal.  Mouth/Throat: Oropharynx is clear and moist.  Eyes: Pupils are equal, round, and reactive to light.  Neck: Normal range of motion. Neck supple.  Cardiovascular: Normal rate, regular rhythm and normal heart sounds.   Pulmonary/Chest: Effort normal and breath sounds normal.  Abdominal: Soft.  Musculoskeletal: Normal range of motion.  Neurological: She  is alert and oriented to person, place, and time. She has normal reflexes.  Skin: Skin is warm.  Psychiatric: She has a normal mood and affect.    ED Course  Procedures (including critical care time) Labs Review Labs Reviewed - No data to display Imaging Review No results found.  EKG Interpretation   None      Date: 11/13/2012  Rate: 73  Rhythm: normal sinus rhythm  QRS Axis: normal  Intervals: normal  ST/T Wave abnormalities: normal  Conduction Disutrbances:none  Narrative Interpretation:   Old EKG Reviewed: none available   MDM Pt feels better after solumedrol, pepcid and benadryl.   Decreased swelling   1. Allergic reaction, initial encounter        Elson Areas, PA-C 11/13/12 1615  Lonia Skinner [place], PA-C 11/13/12 1615  Lonia Skinner [place], [place] 11/13/12 [phone]

## 2012-11-14 NOTE — ED Provider Notes (Signed)
Medical screening examination/treatment/procedure(s) were performed by non-physician practitioner and as supervising physician I was immediately available for consultation/collaboration.  EKG Interpretation     Ventricular Rate:  73 PR Interval:  170 QRS Duration: 92 QT Interval:  376 QTC Calculation: 414 R Axis:   -29 Text Interpretation:  Normal sinus rhythm Septal infarct , age undetermined Abnormal ECG              Candyce Churn, MD 11/14/12 [phone]

## 2012-12-09 ENCOUNTER — Encounter (HOSPITAL_BASED_OUTPATIENT_CLINIC_OR_DEPARTMENT_OTHER): Payer: Self-pay | Admitting: Emergency Medicine

## 2012-12-09 ENCOUNTER — Emergency Department (HOSPITAL_BASED_OUTPATIENT_CLINIC_OR_DEPARTMENT_OTHER)
Admission: EM | Admit: 2012-12-09 | Discharge: 2012-12-09 | Disposition: A | Discharge status: Home / Self Care | Payer: BC Managed Care – PPO | Attending: Emergency Medicine | Admitting: Emergency Medicine

## 2012-12-09 DIAGNOSIS — E119 Type 2 diabetes mellitus without complications: Secondary | ICD-10-CM | POA: Insufficient documentation

## 2012-12-09 DIAGNOSIS — T7840XA Allergy, unspecified, initial encounter: Secondary | ICD-10-CM

## 2012-12-09 DIAGNOSIS — Z880 Allergy status to penicillin: Secondary | ICD-10-CM | POA: Insufficient documentation

## 2012-12-09 DIAGNOSIS — L500 Allergic urticaria: Secondary | ICD-10-CM | POA: Insufficient documentation

## 2012-12-09 DIAGNOSIS — R220 Localized swelling, mass and lump, head: Secondary | ICD-10-CM | POA: Insufficient documentation

## 2012-12-09 DIAGNOSIS — I10 Essential (primary) hypertension: Secondary | ICD-10-CM | POA: Insufficient documentation

## 2012-12-09 DIAGNOSIS — Z79899 Other long term (current) drug therapy: Secondary | ICD-10-CM | POA: Insufficient documentation

## 2012-12-09 HISTORY — DX: Allergy, unspecified, initial encounter: T78.40XA

## 2012-12-09 LAB — GLUCOSE, CAPILLARY: Glucose-Capillary: 106 mg/dL — ABNORMAL HIGH (ref 70–99)

## 2012-12-09 MED ORDER — PREDNISONE 20 MG PO TABS: 60.0000 mg | ORAL_TABLET | Freq: Every day | ORAL | Status: DC

## 2012-12-09 MED ORDER — FAMOTIDINE 20 MG PO TABS: 20.0000 mg | ORAL_TABLET | Freq: Every day | ORAL | Status: DC

## 2012-12-09 MED ORDER — METHYLPREDNISOLONE SODIUM SUCC 125 MG IJ SOLR
125.0000 mg | Freq: Once | INTRAMUSCULAR | Status: AC
  Administered 2012-12-09: 125 mg via INTRAVENOUS
  Filled 2012-12-09: qty 2

## 2012-12-09 MED ORDER — FAMOTIDINE IN NACL 20-0.9 MG/50ML-% IV SOLN
20.0000 mg | Freq: Once | INTRAVENOUS | Status: AC
  Administered 2012-12-09: 20 mg via INTRAVENOUS
  Filled 2012-12-09: qty 50

## 2012-12-09 MED ORDER — DIPHENHYDRAMINE HCL 50 MG/ML IJ SOLN
50.0000 mg | Freq: Once | INTRAMUSCULAR | Status: AC
  Administered 2012-12-09: 50 mg via INTRAVENOUS
  Filled 2012-12-09: qty 1

## 2012-12-09 NOTE — ED Notes (Signed)
Pt states she started a cleanse yesterday with fruit shakes, states she made it herself with spinach, apples and mangos. Also ate peanuts, but has eaten peanuts in the past with no problems. Pt denies any other changes recently. md at bedside for exam.

## 2012-12-09 NOTE — ED Notes (Signed)
Pt assisted to call her daughter for ride home. Daughter states she is on [address] eating breakfast and will be here shortly. Pt states "I'm so sleepy..." denies any other c/o or needs.

## 2012-12-09 NOTE — ED Notes (Signed)
Pt states that either her husband or her daughter will pick her up and drive her home prior to administration of diphenhydramine. Pt states "I just live right up the road..." explained to pt that due to the side effects of diphenhydramine she will have to have someone drive her home. Pt verbalizes understanding.

## 2012-12-09 NOTE — ED Provider Notes (Signed)
CSN: [id number]     Arrival date & time 12/09/12  0708 History   First MD Initiated Contact with Patient 12/09/12 [phone]     Chief Complaint  Patient presents with  . Pruritis  . Facial Swelling   (Consider location/radiation/quality/duration/timing/severity/associated sxs/prior Treatment) HPI Pt with history of DM and HTN reports she woke up this morning with diffuse hives and swollen lips. Hives are itchy, on arms legs and truck. Denies tongue or throat swelling. No difficulty breathing. She had a similar reaction about a month ago. No definite allergen then but she had eaten some peanuts shortly prior. She has been eating peanuts since then with no reaction however. She reports she started a 'cleanse' yesterday with a fruit smoothie but no new foods or meds.   Past Medical History  Diagnosis Date  . Diabetes mellitus   . Hypertension   . Allergic reaction    Past Surgical History  Procedure Laterality Date  . Thyroidectomy     History reviewed. No pertinent family history. History  Substance Use Topics  . Smoking status: Never Smoker   . Smokeless tobacco: Not on file  . Alcohol Use: No   OB History   Grav Para Term Preterm Abortions TAB SAB Ect Mult Living                 Review of Systems All other systems reviewed and are negative except as noted in HPI.   Allergies  Aspirin; Codeine; and Penicillins  Home Medications   Current Outpatient Rx  Name  Route  Sig  Dispense  Refill  . Liraglutide (VICTOZA [place])   Subcutaneous   Inject into the skin.         Marland Kitchen losartan (COZAAR) 50 MG tablet   Oral   Take 50 mg by mouth daily.         . famotidine (PEPCID) 20 MG tablet   Oral   Take 1 tablet (20 mg total) by mouth 2 (two) times daily.   10 tablet   0   . hydrOXYzine (ATARAX/VISTARIL) 25 MG tablet   Oral   Take 1 tablet (25 mg total) by mouth every 6 (six) hours.   12 tablet   0   . levothyroxine (SYNTHROID, LEVOTHROID) 100 MCG tablet   Oral   Take 100  mcg by mouth daily.           . metFORMIN (GLUMETZA) 1000 MG (MOD) 24 hr tablet   Oral   Take 1,000 mg by mouth daily with breakfast.           . simvastatin (ZOCOR) 10 MG tablet   Oral   Take 10 mg by mouth at bedtime.           . sitaGLIPtin (JANUVIA) 25 MG tablet   Oral   Take 25 mg by mouth daily.            BP 133/79  Pulse 85  Temp(Src) 99 F (37.2 C) (Oral)  Resp 18  Ht 5\' 4"  (1.626 m)  Wt 162 lb (73.483 kg)  BMI 27.79 kg/m2  SpO2 100% Physical Exam  Nursing note and vitals reviewed. Constitutional: She is oriented to person, place, and time. She appears well-developed and well-nourished.  HENT:  Head: Normocephalic and atraumatic.  Lips swollen  Eyes: EOM are normal. Pupils are equal, round, and reactive to light.  Neck: Normal range of motion. Neck supple.  Cardiovascular: Normal rate, normal heart sounds and intact distal pulses.  Pulmonary/Chest: Effort normal and breath sounds normal. No stridor.  Abdominal: Bowel sounds are normal. She exhibits no distension. There is no tenderness.  Musculoskeletal: Normal range of motion. She exhibits no edema and no tenderness.  Neurological: She is alert and oriented to person, place, and time. She has normal strength. No cranial nerve deficit or sensory deficit.  Skin: Skin is warm and dry. Rash (diffuse urticaria rash, mild excoriation) noted.  Psychiatric: She has a normal mood and affect.    ED Course  Procedures (including critical care time) Labs Review Labs Reviewed - No data to display Imaging Review No results found.  EKG Interpretation   None       MDM   1. Allergic reaction, initial encounter     Pt feeling better, rash and facial swelling improved. Will continue to observe for rebound reaction. Anticipate she will need referral to an allergist for further evaluation.    Charles B. Bernette Mayers, MD 12/09/12 [phone]

## 2013-04-11 ENCOUNTER — Emergency Department (HOSPITAL_BASED_OUTPATIENT_CLINIC_OR_DEPARTMENT_OTHER): Payer: BC Managed Care – PPO

## 2013-04-11 ENCOUNTER — Encounter (HOSPITAL_BASED_OUTPATIENT_CLINIC_OR_DEPARTMENT_OTHER): Payer: Self-pay | Admitting: Emergency Medicine

## 2013-04-11 ENCOUNTER — Emergency Department (HOSPITAL_BASED_OUTPATIENT_CLINIC_OR_DEPARTMENT_OTHER)
Admission: EM | Admit: 2013-04-11 | Discharge: 2013-04-11 | Disposition: A | Discharge status: Home / Self Care | Payer: BC Managed Care – PPO | Attending: Emergency Medicine | Admitting: Emergency Medicine

## 2013-04-11 DIAGNOSIS — Z880 Allergy status to penicillin: Secondary | ICD-10-CM | POA: Insufficient documentation

## 2013-04-11 DIAGNOSIS — T4995XA Adverse effect of unspecified topical agent, initial encounter: Secondary | ICD-10-CM | POA: Insufficient documentation

## 2013-04-11 DIAGNOSIS — E119 Type 2 diabetes mellitus without complications: Secondary | ICD-10-CM | POA: Insufficient documentation

## 2013-04-11 DIAGNOSIS — I10 Essential (primary) hypertension: Secondary | ICD-10-CM | POA: Insufficient documentation

## 2013-04-11 DIAGNOSIS — R599 Enlarged lymph nodes, unspecified: Secondary | ICD-10-CM

## 2013-04-11 DIAGNOSIS — J3489 Other specified disorders of nose and nasal sinuses: Secondary | ICD-10-CM | POA: Insufficient documentation

## 2013-04-11 DIAGNOSIS — IMO0002 Reserved for concepts with insufficient information to code with codable children: Secondary | ICD-10-CM | POA: Insufficient documentation

## 2013-04-11 DIAGNOSIS — T7840XA Allergy, unspecified, initial encounter: Secondary | ICD-10-CM

## 2013-04-11 DIAGNOSIS — R0789 Other chest pain: Secondary | ICD-10-CM

## 2013-04-11 DIAGNOSIS — R51 Headache: Secondary | ICD-10-CM | POA: Insufficient documentation

## 2013-04-11 DIAGNOSIS — R519 Headache, unspecified: Secondary | ICD-10-CM

## 2013-04-11 DIAGNOSIS — R071 Chest pain on breathing: Secondary | ICD-10-CM | POA: Insufficient documentation

## 2013-04-11 DIAGNOSIS — Z79899 Other long term (current) drug therapy: Secondary | ICD-10-CM | POA: Insufficient documentation

## 2013-04-11 DIAGNOSIS — R591 Generalized enlarged lymph nodes: Secondary | ICD-10-CM

## 2013-04-11 DIAGNOSIS — R0981 Nasal congestion: Secondary | ICD-10-CM

## 2013-04-11 DIAGNOSIS — J029 Acute pharyngitis, unspecified: Secondary | ICD-10-CM | POA: Insufficient documentation

## 2013-04-11 DIAGNOSIS — L299 Pruritus, unspecified: Secondary | ICD-10-CM | POA: Insufficient documentation

## 2013-04-11 LAB — CBC WITH DIFFERENTIAL/PLATELET
Basophils Absolute: 0 10*3/uL (ref 0.0–0.1)
Basophils Relative: 1 % (ref 0–1)
EOS ABS: 0.1 10*3/uL (ref 0.0–0.7)
EOS PCT: 2 % (ref 0–5)
HEMATOCRIT: 38.2 % (ref 36.0–46.0)
HEMOGLOBIN: 13.3 g/dL (ref 12.0–15.0)
LYMPHS ABS: 1.1 10*3/uL (ref 0.7–4.0)
Lymphocytes Relative: 18 % (ref 12–46)
MCH: 30.9 pg (ref 26.0–34.0)
MCHC: 34.8 g/dL (ref 30.0–36.0)
MCV: 88.8 fL (ref 78.0–100.0)
MONO ABS: 0.7 10*3/uL (ref 0.1–1.0)
MONOS PCT: 12 % (ref 3–12)
Neutro Abs: 4.1 10*3/uL (ref 1.7–7.7)
Neutrophils Relative %: 68 % (ref 43–77)
Platelets: 265 10*3/uL (ref 150–400)
RBC: 4.3 MIL/uL (ref 3.87–5.11)
RDW: 12.8 % (ref 11.5–15.5)
WBC: 6 10*3/uL (ref 4.0–10.5)

## 2013-04-11 LAB — COMPREHENSIVE METABOLIC PANEL
ALK PHOS: 43 U/L (ref 39–117)
ALT: 25 U/L (ref 0–35)
AST: 23 U/L (ref 0–37)
Albumin: 3.7 g/dL (ref 3.5–5.2)
BUN: 7 mg/dL (ref 6–23)
CALCIUM: 8.9 mg/dL (ref 8.4–10.5)
CO2: 26 mEq/L (ref 19–32)
Chloride: 102 mEq/L (ref 96–112)
Creatinine, Ser: 0.5 mg/dL (ref 0.50–1.10)
GFR calc non Af Amer: >90 mL/min (ref >90)
GLUCOSE: 147 mg/dL — AB (ref 70–99)
POTASSIUM: 3.9 meq/L (ref 3.7–5.3)
Sodium: 141 mEq/L (ref 137–147)
TOTAL PROTEIN: 8 g/dL (ref 6.0–8.3)
Total Bilirubin: 0.3 mg/dL (ref 0.3–1.2)

## 2013-04-11 LAB — TROPONIN I

## 2013-04-11 MED ORDER — SODIUM CHLORIDE 0.9 % IV BOLUS (SEPSIS)
1000.0000 mL | INTRAVENOUS | Status: AC
  Administered 2013-04-11: 1000 mL via INTRAVENOUS

## 2013-04-11 MED ORDER — LORATADINE 10 MG PO TABS
10.0000 mg | ORAL_TABLET | Freq: Once | ORAL | Status: AC
  Administered 2013-04-11: 10 mg via ORAL
  Filled 2013-04-11: qty 1

## 2013-04-11 MED ORDER — DEXAMETHASONE SODIUM PHOSPHATE 10 MG/ML IJ SOLN
10.0000 mg | Freq: Once | INTRAMUSCULAR | Status: AC
  Administered 2013-04-11: 10 mg via INTRAVENOUS
  Filled 2013-04-11: qty 1

## 2013-04-11 MED ORDER — METOCLOPRAMIDE HCL 5 MG/ML IJ SOLN
5.0000 mg | Freq: Once | INTRAMUSCULAR | Status: AC
  Administered 2013-04-11: 5 mg via INTRAVENOUS
  Filled 2013-04-11: qty 2

## 2013-04-11 MED ORDER — DIPHENHYDRAMINE HCL 50 MG/ML IJ SOLN
25.0000 mg | Freq: Once | INTRAMUSCULAR | Status: AC
  Administered 2013-04-11: 25 mg via INTRAVENOUS
  Filled 2013-04-11: qty 1

## 2013-04-11 NOTE — ED Notes (Signed)
Reports she has had sinus congestion, cough, sore throat- today started breaking out in hives

## 2013-04-11 NOTE — ED Provider Notes (Signed)
CSN: [id number]     Arrival date & time 04/11/13  1925 History  This chart was scribed for Junius ArgyleForrest S Tanasha Menees, MD by Elveria Risingimelie Horne, ED scribe.  This patient was seen in room MH06/MH06 and the patient's care was started at 8:20 PM.   Chief Complaint  Patient presents with  . Allergic Reaction      Patient is a 48 y.o. female presenting with allergic reaction. The history is provided by the patient. No language interpreter was used.  Allergic Reaction Presenting symptoms: itching   Presenting symptoms: no difficulty breathing, no difficulty swallowing, no rash and no wheezing   Itching:    Location:  Full body   Severity:  Moderate   Onset quality:  Gradual   Timing:  Intermittent Severity:  Severe Prior allergic episodes:  Unable to specify Context: no nuts   Relieved by:  Nothing Worsened by:  Nothing tried Ineffective treatments:  None tried  HPI Comments: [name] is a 48 y.o. female who presents to the Emergency Department complaining of an allergic reaction that occurred today. Patient reports breaking out in hives three times today. Patient says that the hives are itchy and cover the entire body. They resolve and then represent. Patient reports prior episodes of a similar allergic reaction. At the time of the last episode it was suspected to have been caused by peanuts. However patient says she has been eating peanuts her entire life and she has not eaten any peanuts prior to current episode. Patient took Benadryl today to treat her hives, which provided temporarily relief and allowed her to get some sleep. Patient also reports having intermittent right sided headache, onset three days ago, for which she finds mild relief by rubbing her temples. Patient says she had a history of headaches before having her thyroid removed. Her headache today is similar to her previous headaches in that it has remained persistent. Patient also reports right sided chest pain which began today. She  says it feels like something is lodged in her chest.   Patient had a sinus infection a few weeks ago for which she received an antibiotic. Her symptoms resolved.Three days ago her symptoms represented including: sinus congestion, facial pressure, mild nonproductive cough, and sore throat.    No tobacco use. No family history of heart disease.  Past Medical History  Diagnosis Date  . Diabetes mellitus   . Hypertension   . Allergic reaction    Past Surgical History  Procedure Laterality Date  . Thyroidectomy     No family history on file. History  Substance Use Topics  . Smoking status: Never Smoker   . Smokeless tobacco: Not on file  . Alcohol Use: No   OB History   Grav Para Term Preterm Abortions TAB SAB Ect Mult Living                 Review of Systems  Constitutional: Negative for fever.  HENT: Positive for congestion and sore throat. Negative for trouble swallowing.   Eyes: Negative for pain.  Respiratory: Positive for cough. Negative for shortness of breath and wheezing.   Cardiovascular: Negative for chest pain.  Gastrointestinal: Negative for vomiting, abdominal pain and diarrhea.  Genitourinary: Negative for dysuria.  Musculoskeletal: Negative for neck pain.  Skin: Positive for itching. Negative for rash.  Allergic/Immunologic: Negative for immunocompromised state.  Neurological: Negative for headaches.  Hematological: Negative for adenopathy.  Psychiatric/Behavioral: Negative for behavioral problems.      Allergies  Aspirin; Codeine;  and Penicillins  Home Medications   Current Outpatient Rx  Name  Route  Sig  Dispense  Refill  . levothyroxine (SYNTHROID, LEVOTHROID) 100 MCG tablet   Oral   Take 100 mcg by mouth daily.           . Liraglutide (VICTOZA [place])   Subcutaneous   Inject into the skin.         Marland Kitchen losartan (COZAAR) 50 MG tablet   Oral   Take 50 mg by mouth daily.         . metFORMIN (GLUMETZA) 1000 MG (MOD) 24 hr tablet   Oral    Take 1,000 mg by mouth daily with breakfast.           . pseudoephedrine-guaifenesin (MUCINEX D) 60-600 MG per tablet   Oral   Take 1 tablet by mouth every 12 (twelve) hours.         . famotidine (PEPCID) 20 MG tablet   Oral   Take 1 tablet (20 mg total) by mouth daily.   10 tablet   0   . predniSONE (DELTASONE) 20 MG tablet   Oral   Take 3 tablets (60 mg total) by mouth daily.   15 tablet   0    Triage Vitals: BP 150/88  Pulse 88  Temp(Src) 98.5 F (36.9 C) (Oral)  Resp 20  Ht 5\' 4"  (1.626 m)  Wt 156 lb (70.761 kg)  BMI 26.76 kg/m2  SpO2 100% Physical Exam  Nursing note and vitals reviewed. Constitutional: She is oriented to person, place, and time. She appears well-developed and well-nourished. No distress.  HENT:  Head: Normocephalic and atraumatic.  Mouth/Throat: Oropharynx is clear and moist. No oropharyngeal exudate.  Mild tenderness to palpation of maxillary sinuses. Moderate nasal congestion.   Eyes: Conjunctivae and EOM are normal. Pupils are equal, round, and reactive to light. Right eye exhibits no discharge. Left eye exhibits no discharge.  Neck: Normal range of motion. Neck supple. No tracheal deviation present.  Cardiovascular: Normal rate, regular rhythm, normal heart sounds and intact distal pulses.  Exam reveals no gallop and no friction rub.   No murmur heard. Pulmonary/Chest: Effort normal and breath sounds normal. No respiratory distress. She has no wheezes. She has no rales. She exhibits tenderness.  Tenderness to palpation of right anterior chest.   Abdominal: Soft. Bowel sounds are normal. She exhibits no distension and no mass. There is no tenderness. There is no rebound and no guarding.  Musculoskeletal: Normal range of motion. She exhibits no edema and no tenderness.  Neurological: She is alert and oriented to person, place, and time.  Skin: Skin is warm and dry.  Psychiatric: She has a normal mood and affect. Her behavior is normal.     ED Course  Procedures (including critical care time) DIAGNOSTIC STUDIES: Oxygen Saturation is 100% on room air, normal by my interpretation.    COORDINATION OF CARE: 8:35 PM- Pt advised of plan for treatment and pt agrees.    Labs Review Labs Reviewed  COMPREHENSIVE METABOLIC PANEL - Abnormal; Notable for the following:    Glucose, Bld 147 (*)    All other components within normal limits  CBC WITH DIFFERENTIAL  TROPONIN I   Imaging Review Dg Chest 2 View  04/11/2013   CLINICAL DATA:  Chest pain after allergic reaction  EXAM: CHEST  2 VIEW  COMPARISON:  April 18, 2006 chest radiograph and chest CT May 30, 2012  FINDINGS: Lungs are clear. Heart size and  pulmonary vascularity are normal. Adenopathy in the aortopulmonary window and right hilar regions are noted. Subcarinal adenopathy is suggested, better seen on prior CT. No bone lesions. There are surgical clips in the thyroid region. There are also surgical clips the right axillary region.  IMPRESSION: Adenopathy, better seen on prior CT. Neoplastic etiology must be of concern given this finding.  No edema or consolidation.  Areas of postoperative change.   Electronically Signed   By: Bretta Bang M.D.   On: 04/11/2013 21:27     EKG Interpretation   Date/Time:  Saturday April 11 2013 20:45:29 EDT Ventricular Rate:  73 PR Interval:  164 QRS Duration: 86 QT Interval:  384 QTC Calculation: 423 R Axis:   -46 Text Interpretation:  Normal sinus rhythm Pulmonary disease pattern Left  anterior fascicular block No significant change since last tracing  Confirmed by Keziah Avis  MD, Rochele Lueck ([id number]) on 04/11/2013 9:27:01 PM      MDM   Final diagnoses:  Sinus congestion  Headache  Chest wall pain  Adenopathy  Allergic reaction    11:23 PM 48 y.o. female here with multiple complaints including headache, intermittent hives, right-sided chest pain, and sinus congestion. She is afebrile and vital signs are unremarkable here. Chest  pain is atypical, reproducible, and is not her primary complaint. Will get screening labwork, chest x-ray, treatment for headache and itching. No obvious rash seen on exam.  11:27 PM: HA now gone. I interpreted/reviewed the labs and/or imaging which were non-contributory.  Pt feeling better. Low risk for MACE per HEART score. Adenopathy noted on CXR, possibly neoplastic etiology? Will rec f/u w/ pcp for further evaluation.  I have discussed the diagnosis/risks/treatment options with the patient and believe the pt to be eligible for discharge home to follow-up with pcp next week. We also discussed returning to the ED immediately if new or worsening sx occur. We discussed the sx which are most concerning (e.g., worsening pain, sob, return of HA, worsening itching) that necessitate immediate return. Medications administered to the patient during their visit and any new prescriptions provided to the patient are listed below.  Medications given during this visit Medications  sodium chloride 0.9 % bolus 1,000 mL (0 mLs Intravenous Stopped 04/11/13 2310)  dexamethasone (DECADRON) injection 10 mg (10 mg Intravenous Given 04/11/13 2108)  metoCLOPramide (REGLAN) injection 5 mg (5 mg Intravenous Given 04/11/13 2108)  diphenhydrAMINE (BENADRYL) injection 25 mg (25 mg Intravenous Given 04/11/13 2108)  loratadine (CLARITIN) tablet 10 mg (10 mg Oral Given 04/11/13 2220)    New Prescriptions   No medications on file      I personally performed the services described in this documentation, which was scribed in my presence. The recorded information has been reviewed and is accurate.    Junius Argyle, MD 04/11/13 2328

## 2013-04-11 NOTE — Discharge Instructions (Signed)
Allergies ° Allergies may happen from anything your body is sensitive to. This may be food, medicines, pollens, chemicals, and many other things. Food allergies can be severe and deadly.  °HOME CARE °· If you do not know what causes a reaction, keep a diary. Write down the foods you ate and the symptoms that followed. Avoid foods that cause reactions. °· If you have red raised spots (hives) or a rash: °· Take medicine as told by your doctor. °· Use medicines for red raised spots and itching as needed. °· Apply cold cloths (compresses) to the skin. Take a cool bath. Avoid hot baths or showers. °· If you are severely allergic: °· It is often necessary to go to the hospital after you have treated your reaction. °· Wear your medical alert jewelry. °· You and your family must learn how to give a allergy shot or use an allergy kit (anaphylaxis kit). °· Always carry your allergy kit or shot with you. Use this medicine as told by your doctor if a severe reaction is occurring. °GET HELP RIGHT AWAY IF: °· You have trouble breathing or are making high-pitched whistling sounds (wheezing). °· You have a tight feeling in your chest or throat. °· You have a puffy (swollen) mouth. °· You have red raised spots, puffiness (swelling), or itching all over your body. °· You have had a severe reaction that was helped by your allergy kit or shot. The reaction can return once the medicine has worn off. °· You think you are having a food allergy. Symptoms most often happen within 30 minutes of eating a food. °· Your symptoms have not gone away within 2 days or are getting worse. °· You have new symptoms. °· You want to retest yourself with a food or drink you think causes an allergic reaction. Only do this under the care of a doctor. °MAKE SURE YOU:  °· Understand these instructions. °· Will watch your condition. °· Will get help right away if you are not doing well or get worse. °Document Released: 04/21/2012 Document Reviewed:  04/21/2012 °ExitCare® Patient Information ©2014 [employer]. ° °

## 2013-10-10 ENCOUNTER — Emergency Department (HOSPITAL_BASED_OUTPATIENT_CLINIC_OR_DEPARTMENT_OTHER): Payer: BC Managed Care – PPO

## 2013-10-10 ENCOUNTER — Emergency Department (HOSPITAL_BASED_OUTPATIENT_CLINIC_OR_DEPARTMENT_OTHER)
Admission: EM | Admit: 2013-10-10 | Discharge: 2013-10-10 | Disposition: A | Discharge status: Home / Self Care | Payer: BC Managed Care – PPO | Attending: Emergency Medicine | Admitting: Emergency Medicine

## 2013-10-10 ENCOUNTER — Encounter (HOSPITAL_BASED_OUTPATIENT_CLINIC_OR_DEPARTMENT_OTHER): Payer: Self-pay | Admitting: Emergency Medicine

## 2013-10-10 DIAGNOSIS — Z7952 Long term (current) use of systemic steroids: Secondary | ICD-10-CM | POA: Insufficient documentation

## 2013-10-10 DIAGNOSIS — R42 Dizziness and giddiness: Secondary | ICD-10-CM | POA: Diagnosis not present

## 2013-10-10 DIAGNOSIS — Z880 Allergy status to penicillin: Secondary | ICD-10-CM | POA: Diagnosis not present

## 2013-10-10 DIAGNOSIS — R05 Cough: Secondary | ICD-10-CM | POA: Diagnosis present

## 2013-10-10 DIAGNOSIS — E119 Type 2 diabetes mellitus without complications: Secondary | ICD-10-CM | POA: Diagnosis not present

## 2013-10-10 DIAGNOSIS — J069 Acute upper respiratory infection, unspecified: Secondary | ICD-10-CM

## 2013-10-10 DIAGNOSIS — I10 Essential (primary) hypertension: Secondary | ICD-10-CM | POA: Diagnosis not present

## 2013-10-10 DIAGNOSIS — Z79899 Other long term (current) drug therapy: Secondary | ICD-10-CM | POA: Diagnosis not present

## 2013-10-10 DIAGNOSIS — R630 Anorexia: Secondary | ICD-10-CM | POA: Diagnosis not present

## 2013-10-10 MED ORDER — IBUPROFEN 800 MG PO TABS
800.0000 mg | ORAL_TABLET | Freq: Once | ORAL | Status: AC
  Administered 2013-10-10: 800 mg via ORAL
  Filled 2013-10-10: qty 1

## 2013-10-10 MED ORDER — IBUPROFEN 800 MG PO TABS: 800.0000 mg | ORAL_TABLET | Freq: Three times a day (TID) | ORAL | Status: DC | PRN

## 2013-10-10 MED ORDER — BENZONATATE 100 MG PO CAPS
200.0000 mg | ORAL_CAPSULE | Freq: Once | ORAL | Status: AC
  Administered 2013-10-10: 200 mg via ORAL
  Filled 2013-10-10: qty 2

## 2013-10-10 MED ORDER — BENZONATATE 200 MG PO CAPS: 200.0000 mg | ORAL_CAPSULE | Freq: Two times a day (BID) | ORAL | Status: DC | PRN

## 2013-10-10 MED ORDER — SODIUM CHLORIDE 0.9 % IV BOLUS (SEPSIS)
1000.0000 mL | Freq: Once | INTRAVENOUS | Status: AC
Start: 2013-10-10 — End: 2013-10-10
  Administered 2013-10-10: 1000 mL via INTRAVENOUS

## 2013-10-10 MED ORDER — MECLIZINE HCL 25 MG PO TABS
25.0000 mg | ORAL_TABLET | Freq: Once | ORAL | Status: AC
  Administered 2013-10-10: 25 mg via ORAL
  Filled 2013-10-10: qty 1

## 2013-10-10 NOTE — Discharge Instructions (Signed)
Upper Respiratory Infection, Adult An upper respiratory infection (URI) is also sometimes known as the common cold. The upper respiratory tract includes the nose, sinuses, throat, trachea, and bronchi. Bronchi are the airways leading to the lungs. Most people improve within 1 week, but symptoms can last up to 2 weeks. A residual cough may last even longer.  CAUSES Many different viruses can infect the tissues lining the upper respiratory tract. The tissues become irritated and inflamed and often become very moist. Mucus production is also common. A cold is contagious. You can easily spread the virus to others by oral contact. This includes kissing, sharing a glass, coughing, or sneezing. Touching your mouth or nose and then touching a surface, which is then touched by another person, can also spread the virus. SYMPTOMS  Symptoms typically develop 1 to 3 days after you come in contact with a cold virus. Symptoms vary from person to person. They may include:  Runny nose.  Sneezing.  Nasal congestion.  Sinus irritation.  Sore throat.  Loss of voice (laryngitis).  Cough.  Fatigue.  Muscle aches.  Loss of appetite.  Headache.  Low-grade fever. DIAGNOSIS  You might diagnose your own cold based on familiar symptoms, since most people get a cold 2 to 3 times a year. Your caregiver can confirm this based on your exam. Most importantly, your caregiver can check that your symptoms are not due to another disease such as strep throat, sinusitis, pneumonia, asthma, or epiglottitis. Blood tests, throat tests, and X-rays are not necessary to diagnose a common cold, but they may sometimes be helpful in excluding other more serious diseases. Your caregiver will decide if any further tests are required. RISKS AND COMPLICATIONS  You may be at risk for a more severe case of the common cold if you smoke cigarettes, have chronic heart disease (such as heart failure) or lung disease (such as asthma), or if  you have a weakened immune system. The very young and very old are also at risk for more serious infections. Bacterial sinusitis, middle ear infections, and bacterial pneumonia can complicate the common cold. The common cold can worsen asthma and chronic obstructive pulmonary disease (COPD). Sometimes, these complications can require emergency medical care and may be life-threatening. PREVENTION  The best way to protect against getting a cold is to practice good hygiene. Avoid oral or hand contact with people with cold symptoms. Wash your hands often if contact occurs. There is no clear evidence that vitamin C, vitamin E, echinacea, or exercise reduces the chance of developing a cold. However, it is always recommended to get plenty of rest and practice good nutrition. TREATMENT  Treatment is directed at relieving symptoms. There is no cure. Antibiotics are not effective, because the infection is caused by a virus, not by bacteria. Treatment may include:  Increased fluid intake. Sports drinks offer valuable electrolytes, sugars, and fluids.  Breathing heated mist or steam (vaporizer or shower).  Eating chicken soup or other clear broths, and maintaining good nutrition.  Getting plenty of rest.  Using gargles or lozenges for comfort.  Controlling fevers with ibuprofen or acetaminophen as directed by your caregiver.  Increasing usage of your inhaler if you have asthma. Zinc gel and zinc lozenges, taken in the first 24 hours of the common cold, can shorten the duration and lessen the severity of symptoms. Pain medicines may help with fever, muscle aches, and throat pain. A variety of non-prescription medicines are available to treat congestion and runny nose. Your caregiver   can make recommendations and may suggest nasal or lung inhalers for other symptoms.  HOME CARE INSTRUCTIONS   Only take over-the-counter or prescription medicines for pain, discomfort, or fever as directed by your  caregiver.  Use a warm mist humidifier or inhale steam from a shower to increase air moisture. This may keep secretions moist and make it easier to breathe.  Drink enough water and fluids to keep your urine clear or pale yellow.  Rest as needed.  Return to work when your temperature has returned to normal or as your caregiver advises. You may need to stay home longer to avoid infecting others. You can also use a face mask and careful hand washing to prevent spread of the virus. SEEK MEDICAL CARE IF:   After the first few days, you feel you are getting worse rather than better.  You need your caregiver's advice about medicines to control symptoms.  You develop chills, worsening shortness of breath, or brown or red sputum. These may be signs of pneumonia.  You develop yellow or brown nasal discharge or pain in the face, especially when you bend forward. These may be signs of sinusitis.  You develop a fever, swollen neck glands, pain with swallowing, or white areas in the back of your throat. These may be signs of strep throat. SEEK IMMEDIATE MEDICAL CARE IF:   You have a fever.  You develop severe or persistent headache, ear pain, sinus pain, or chest pain.  You develop wheezing, a prolonged cough, cough up blood, or have a change in your usual mucus (if you have chronic lung disease).  You develop sore muscles or a stiff neck. Document Released: 06/20/2000 Document Revised: 03/19/2011 Document Reviewed: 04/01/2013 ExitCare Patient Information 2015 [employer]. This information is not intended to replace advice given to you by your health care provider. Make sure you discuss any questions you have with your health care provider.  

## 2013-10-10 NOTE — ED Notes (Signed)
Pt reports cough, sore throat, bodyaches, and congestion for past few days.

## 2013-10-10 NOTE — ED Provider Notes (Signed)
CSN: [id number]     Arrival date & time 10/10/13  1446 History   First MD Initiated Contact with Patient 10/10/13 1457     Chief Complaint  Patient presents with  . Cough  . Nasal Congestion     (Consider location/radiation/quality/duration/timing/severity/associated sxs/prior Treatment) Patient is a 48 y.o. female presenting with cough. The history is provided by the patient.  Cough Cough characteristics:  Productive Sputum characteristics:  Green and yellow Severity:  Moderate Onset quality:  Gradual Duration:  3 days Timing:  Constant Progression:  Worsening Chronicity:  New Smoker: no   Context: not sick contacts and not weather changes   Relieved by:  Nothing Worsened by:  Nothing tried Ineffective treatments:  Cough suppressants Associated symptoms: chills, rhinorrhea (mild) and sore throat   Associated symptoms: no chest pain, no ear pain, no fever and no shortness of breath     Past Medical History  Diagnosis Date  . Diabetes mellitus   . Hypertension   . Allergic reaction    Past Surgical History  Procedure Laterality Date  . Thyroidectomy     No family history on file. History  Substance Use Topics  . Smoking status: Never Smoker   . Smokeless tobacco: Not on file  . Alcohol Use: No   OB History   Grav Para Term Preterm Abortions TAB SAB Ect Mult Living                 Review of Systems  Constitutional: Positive for chills and appetite change (decreased). Negative for fever.  HENT: Positive for congestion, rhinorrhea (mild) and sore throat. Negative for ear discharge and ear pain.   Respiratory: Positive for cough. Negative for shortness of breath.   Cardiovascular: Negative for chest pain and leg swelling.  Gastrointestinal: Negative for nausea, vomiting and abdominal pain.  Neurological: Positive for dizziness.  All other systems reviewed and are negative.     Allergies  Aspirin; Codeine; and Penicillins  Home Medications   Prior to  Admission medications   Medication Sig Start Date End Date Taking? Authorizing Provider  famotidine (PEPCID) 20 MG tablet Take 1 tablet (20 mg total) by mouth daily. 12/09/12   Charles B. Bernette MayersSheldon, MD  levothyroxine (SYNTHROID, LEVOTHROID) 100 MCG tablet Take 100 mcg by mouth daily.      Historical Provider, MD  Liraglutide (VICTOZA [place]) Inject into the skin.    Historical Provider, MD  losartan (COZAAR) 50 MG tablet Take 50 mg by mouth daily.    Historical Provider, MD  metFORMIN (GLUMETZA) 1000 MG (MOD) 24 hr tablet Take 1,000 mg by mouth daily with breakfast.      Historical Provider, MD  predniSONE (DELTASONE) 20 MG tablet Take 3 tablets (60 mg total) by mouth daily. 12/09/12   Charles B. Bernette MayersSheldon, MD  pseudoephedrine-guaifenesin [facility](MUCINEX D) 60-600 MG per tablet Take 1 tablet by mouth every 12 (twelve) hours.    Historical Provider, MD   BP 151/77  Pulse 105  Temp(Src) 99.3 F (37.4 C) (Oral)  Resp 20  Ht 5\' 4"  (1.626 m)  Wt 165 lb (74.844 kg)  BMI 28.31 kg/m2  SpO2 96% Physical Exam  Nursing note and vitals reviewed. Constitutional: She is oriented to person, place, and time. She appears well-developed and well-nourished. No distress.  HENT:  Head: Normocephalic and atraumatic.  Right Ear: Tympanic membrane normal.  Left Ear: Tympanic membrane normal.  Mouth/Throat: Oropharynx is clear and moist. No oropharyngeal exudate.  Eyes: EOM are normal. Pupils are equal, round,  and reactive to light.  Neck: Normal range of motion. Neck supple.  Cardiovascular: Normal rate and regular rhythm.  Exam reveals no friction rub.   No murmur heard. Pulmonary/Chest: Effort normal and breath sounds normal. No respiratory distress. She has no wheezes. She has no rales.  Abdominal: Soft. She exhibits no distension. There is no tenderness. There is no rebound.  Musculoskeletal: Normal range of motion. She exhibits no edema.  Neurological: She is alert and oriented to person, place, and time. No cranial  nerve deficit. She exhibits normal muscle tone. Coordination normal.  Skin: No rash noted. She is not diaphoretic.    ED Course  Procedures (including critical care time) Labs Review Labs Reviewed - No data to display  Imaging Review Dg Chest 2 View  10/10/2013   CLINICAL DATA:  cough, sore throat, bodyaches, weakness, dizziness and congestion x 2 days. E  EXAM: CHEST - 2 VIEW  COMPARISON:  04/11/2013  FINDINGS: Lungs are clear. Heart size and mediastinal contours are within normal limits. No effusion.  Surgical clips right axilla and thoracic inlet. Visualized skeletal structures are unremarkable.  IMPRESSION: No acute cardiopulmonary disease.   Electronically Signed   By: Oley Balm M.D.   On: 10/10/2013 15:19     EKG Interpretation None      MDM   Final diagnoses:  Acute URI    4F here with URI symptoms - aches, rhinorrhea, cough. Productive cough, nonsmoker. Chills but no documented fevers. Reports some light-headedness and spinning type dizziness. Having lots of myalgias in her upper back. Exam benign. Will check CXR, given fluids, meclizine, motrin.  Feeling better after meds. Stable for discharge. No PNA on CXR.  Elwin Mocha, MD 10/10/13 [phone]

## 2013-12-16 ENCOUNTER — Encounter (HOSPITAL_BASED_OUTPATIENT_CLINIC_OR_DEPARTMENT_OTHER): Payer: Self-pay | Admitting: *Deleted

## 2013-12-16 ENCOUNTER — Emergency Department (HOSPITAL_BASED_OUTPATIENT_CLINIC_OR_DEPARTMENT_OTHER)
Admission: EM | Admit: 2013-12-16 | Discharge: 2013-12-16 | Disposition: A | Discharge status: Home / Self Care | Payer: BC Managed Care – PPO | Attending: Emergency Medicine | Admitting: Emergency Medicine

## 2013-12-16 DIAGNOSIS — I10 Essential (primary) hypertension: Secondary | ICD-10-CM | POA: Insufficient documentation

## 2013-12-16 DIAGNOSIS — E119 Type 2 diabetes mellitus without complications: Secondary | ICD-10-CM | POA: Diagnosis not present

## 2013-12-16 DIAGNOSIS — Y998 Other external cause status: Secondary | ICD-10-CM | POA: Insufficient documentation

## 2013-12-16 DIAGNOSIS — Z79899 Other long term (current) drug therapy: Secondary | ICD-10-CM | POA: Insufficient documentation

## 2013-12-16 DIAGNOSIS — S51851A Open bite of right forearm, initial encounter: Secondary | ICD-10-CM | POA: Diagnosis not present

## 2013-12-16 DIAGNOSIS — Y92219 Unspecified school as the place of occurrence of the external cause: Secondary | ICD-10-CM | POA: Insufficient documentation

## 2013-12-16 DIAGNOSIS — Y9389 Activity, other specified: Secondary | ICD-10-CM | POA: Diagnosis not present

## 2013-12-16 DIAGNOSIS — W503XXA Accidental bite by another person, initial encounter: Secondary | ICD-10-CM

## 2013-12-16 DIAGNOSIS — Z23 Encounter for immunization: Secondary | ICD-10-CM | POA: Diagnosis not present

## 2013-12-16 DIAGNOSIS — Z880 Allergy status to penicillin: Secondary | ICD-10-CM | POA: Diagnosis not present

## 2013-12-16 MED ORDER — SULFAMETHOXAZOLE-TRIMETHOPRIM 800-160 MG PO TABS
1.0000 | ORAL_TABLET | Freq: Two times a day (BID) | ORAL | Status: DC
Start: 2013-12-16 — End: 2013-12-16

## 2013-12-16 MED ORDER — TETANUS-DIPHTH-ACELL PERTUSSIS 5-2.5-18.5 LF-MCG/0.5 IM SUSP
0.5000 mL | Freq: Once | INTRAMUSCULAR | Status: AC
  Administered 2013-12-16: 0.5 mL via INTRAMUSCULAR
  Filled 2013-12-16: qty 0.5

## 2013-12-16 MED ORDER — METRONIDAZOLE 500 MG PO TABS: 500.0000 mg | ORAL_TABLET | Freq: Three times a day (TID) | ORAL | Status: DC

## 2013-12-16 MED ORDER — SULFAMETHOXAZOLE-TRIMETHOPRIM 800-160 MG PO TABS
1.0000 | ORAL_TABLET | Freq: Two times a day (BID) | ORAL | Status: DC
Start: 2013-12-16 — End: 2016-02-04

## 2013-12-16 MED ORDER — METRONIDAZOLE 500 MG PO TABS: 500.0000 mg | ORAL_TABLET | Freq: Two times a day (BID) | ORAL | Status: DC

## 2013-12-16 NOTE — Discharge Instructions (Signed)
Please follow up with your primary care physician in 1-2 days. If you do not have one please call the [facility] number listed above. Please take your antibiotic until completion. Please read all discharge instructions and return precautions.  ° ° °Human Bite °Human bite wounds tend to become infected, even when they seem minor at first. Bite wounds of the hand can be serious because the tendons and joints are close to the skin. Infection can develop very rapidly, even in a matter of hours.  °DIAGNOSIS  °Your caregiver will most likely: °· Take a detailed history of the bite injury. °· Perform a wound exam. °· Take your medical history. °Blood tests or X-rays may be performed. Sometimes, infected bite wounds are cultured and sent to a lab to identify the infectious bacteria. °TREATMENT  °Medical treatment will depend on the location of the bite as well as the patient's medical history. Treatment may include: °· Wound care, such as cleaning and flushing the wound with saline solution, bandaging, and elevating the affected area. °· Antibiotic medicine. °· Tetanus immunization. °· Leaving the wound open to heal. This is often done with human bites due to the high risk of infection. However, in certain cases, wound closure with stitches, wound adhesive, skin adhesive strips, or staples may be used. °Infected bites that are left untreated may require intravenous (IV) antibiotics and surgical treatment in the hospital. °HOME CARE INSTRUCTIONS °· Follow your caregiver's instructions for wound care. °· Take all medicines as directed. °· If your caregiver prescribes antibiotics, take them as directed. Finish them even if you start to feel better. °· Follow up with your caregiver for further exams or immunizations as directed. °You may need a tetanus shot if: °· You cannot remember when you had your last tetanus shot. °· You have never had a tetanus shot. °· The injury broke your skin. °If you get a  tetanus shot, your arm may swell, get red, and feel warm to the touch. This is common and not a problem. If you need a tetanus shot and you choose not to have one, there is a rare chance of getting tetanus. Sickness from tetanus can be serious. °SEEK IMMEDIATE MEDICAL CARE IF: °· You have increased pain, swelling, or redness around the bite wound. °· You have chills. °· You have a fever. °· You have pus draining from the wound. °· You have red streaks on the skin coming from the wound. °· You have pain with movement or trouble moving the injured part. °· You are not improving, or you are getting worse. °· You have any other questions or concerns. °MAKE SURE YOU: °· Understand these instructions. °· Will watch your condition. °· Will get help right away if you are not doing well or get worse. °Document Released: 02/02/2004 Document Revised: 03/19/2011 Document Reviewed: 08/16/2010 °[employer]® Patient Information ©2015 [employer]. This information is not intended to replace advice given to you by your health care provider. Make sure you discuss any questions you have with your health care provider. ° °
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
## 2014-11-05 ENCOUNTER — Encounter (HOSPITAL_BASED_OUTPATIENT_CLINIC_OR_DEPARTMENT_OTHER): Payer: Self-pay | Admitting: *Deleted

## 2014-11-05 ENCOUNTER — Emergency Department (HOSPITAL_BASED_OUTPATIENT_CLINIC_OR_DEPARTMENT_OTHER): Payer: BC Managed Care – PPO

## 2014-11-05 ENCOUNTER — Emergency Department (HOSPITAL_BASED_OUTPATIENT_CLINIC_OR_DEPARTMENT_OTHER)
Admission: EM | Admit: 2014-11-05 | Discharge: 2014-11-06 | Disposition: A | Discharge status: Home / Self Care | Payer: BC Managed Care – PPO | Attending: Emergency Medicine | Admitting: Emergency Medicine

## 2014-11-05 DIAGNOSIS — I10 Essential (primary) hypertension: Secondary | ICD-10-CM | POA: Diagnosis not present

## 2014-11-05 DIAGNOSIS — Z79899 Other long term (current) drug therapy: Secondary | ICD-10-CM | POA: Insufficient documentation

## 2014-11-05 DIAGNOSIS — Z880 Allergy status to penicillin: Secondary | ICD-10-CM | POA: Insufficient documentation

## 2014-11-05 DIAGNOSIS — E119 Type 2 diabetes mellitus without complications: Secondary | ICD-10-CM | POA: Insufficient documentation

## 2014-11-05 DIAGNOSIS — Z792 Long term (current) use of antibiotics: Secondary | ICD-10-CM | POA: Diagnosis not present

## 2014-11-05 DIAGNOSIS — R079 Chest pain, unspecified: Secondary | ICD-10-CM | POA: Diagnosis present

## 2014-11-05 DIAGNOSIS — R0789 Other chest pain: Secondary | ICD-10-CM

## 2014-11-05 NOTE — ED Notes (Signed)
Pressure in chest x couple hours PTA.  Pt reports nausea and SOB.  Denies vomiting.  Denies diaphoresis.

## 2014-11-06 LAB — CBC
HCT: 41.6 % (ref 36.0–46.0)
Hemoglobin: 14.1 g/dL (ref 12.0–15.0)
MCH: 28.7 pg (ref 26.0–34.0)
MCHC: 33.9 g/dL (ref 30.0–36.0)
MCV: 84.6 fL (ref 78.0–100.0)
Platelets: 334 10*3/uL (ref 150–400)
RBC: 4.92 MIL/uL (ref 3.87–5.11)
RDW: 13 % (ref 11.5–15.5)
WBC: 6.9 10*3/uL (ref 4.0–10.5)

## 2014-11-06 LAB — BASIC METABOLIC PANEL
ANION GAP: 9 (ref 5–15)
BUN: 13 mg/dL (ref 6–20)
CO2: 28 mmol/L (ref 22–32)
Calcium: 9.5 mg/dL (ref 8.9–10.3)
Chloride: 101 mmol/L (ref 101–111)
Creatinine, Ser: 0.52 mg/dL (ref 0.44–1.00)
GLUCOSE: 164 mg/dL — AB (ref 65–99)
Potassium: 3.8 mmol/L (ref 3.5–5.1)
Sodium: 138 mmol/L (ref 135–145)

## 2014-11-06 LAB — TROPONIN I: Troponin I: <0.03 ng/mL (ref <0.031)

## 2014-11-06 MED ORDER — IBUPROFEN 800 MG PO TABS: 800.0000 mg | ORAL_TABLET | Freq: Three times a day (TID) | ORAL | Status: DC | PRN

## 2014-11-06 MED ORDER — IBUPROFEN 200 MG PO TABS
600.0000 mg | ORAL_TABLET | Freq: Once | ORAL | Status: AC
  Administered 2014-11-06: 600 mg via ORAL
  Filled 2014-11-06 (×2): qty 1

## 2014-11-06 NOTE — Discharge Instructions (Signed)

## 2014-11-06 NOTE — ED Notes (Signed)
Troponin labs obtained and sent.

## 2014-11-06 NOTE — ED Provider Notes (Signed)
CSN: [id number]     Arrival date & time 11/05/14  2301 History   First MD Initiated Contact with Patient 11/06/14 0126     Chief Complaint  Patient presents with  . Chest Pain     (Consider location/radiation/quality/duration/timing/severity/associated sxs/prior Treatment) HPI  This is a 49 year old female with a history of diabetes and hypertension. 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   She is here with chest pain that began about 2 hours prior to arrival. The pain is located in the right upper chest and is described as a pressure. It is associated with shortness of breath. She has been nauseated all day and did not note any change in the nausea with the pain. She has not vomited. She states the pain was severe enough at its worst to make her cry at its worst. It has subsequently improved. Pain is exacerbated by palpation or movement of her right chest wall.  Past Medical History  Diagnosis Date  . Diabetes mellitus   . Hypertension   . Allergic reaction    Past Surgical History  Procedure Laterality Date  . Thyroidectomy     History reviewed. No pertinent family history. Social History  Substance Use Topics  . Smoking status: Never Smoker   . Smokeless tobacco: None  . Alcohol Use: No   OB History    No data available     Review of Systems  All other systems reviewed and are negative.   Allergies  Aspirin; Codeine; and Penicillins  Home Medications   Prior to Admission medications   Medication Sig Start Date End Date Taking? Authorizing Provider  famotidine (PEPCID) 20 MG tablet Take 1 tablet (20 mg total) by mouth daily. 12/09/12   Susy Frizzleharles Sheldon, MD  ibuprofen (ADVIL,MOTRIN) 800 MG tablet Take 1 tablet (800 mg total) by mouth every 8 (eight) hours as needed for moderate pain. 10/10/13   Elwin MochaBlair Walden, MD  levothyroxine (SYNTHROID, LEVOTHROID) 100 MCG tablet Take 100 mcg by mouth daily.      Historical Provider, MD  Liraglutide  (VICTOZA [place]) Inject into the skin.    Historical Provider, MD  losartan (COZAAR) 50 MG tablet Take 50 mg by mouth daily.    Historical Provider, MD  metFORMIN (GLUMETZA) 1000 MG (MOD) 24 hr tablet Take 1,000 mg by mouth daily with breakfast.      Historical Provider, MD  metroNIDAZOLE (FLAGYL) 500 MG tablet Take 1 tablet (500 mg total) by mouth 3 (three) times daily. 12/16/13   Jennifer Piepenbrink, PA-C  sulfamethoxazole-trimethoprim (SEPTRA DS) 800-160 MG per tablet Take 1 tablet by mouth every 12 (twelve) hours. 12/16/13   Jennifer Piepenbrink, PA-C   BP 119/71 mmHg  Pulse 80  Temp(Src) 98.1 F (36.7 C) (Oral)  Resp 11  Ht 5\' 4"  (1.626 m)  Wt 155 lb (70.308 kg)  BMI 26.59 kg/m2  SpO2 100%   Physical Exam  General: Well-developed, well-nourished female in no acute distress; appearance consistent with age of record HENT: normocephalic; atraumatic Eyes: pupils equal, round and reactive to light; extraocular muscles intact Neck: supple Heart: regular rate and rhythm Lungs: clear to auscultation bilaterally Chest: Significant right parasternal tenderness which reproduces the pain of the chief complaint Abdomen: soft; nondistended; nontender; no masses or hepatosplenomegaly; bowel sounds present Extremities: No deformity; full range of motion; pulses normal Neurologic: Awake, alert and oriented; motor function intact in all extremities and symmetric; no facial droop Skin: Warm and dry Psychiatric: Flat affect  ED Course  Procedures (including critical care time)   EKG Interpretation   Date/Time:  Friday November 05 2014 23:16:01 EDT Ventricular Rate:  85 PR Interval:  158 QRS Duration: 92 QT Interval:  366 QTC Calculation: 435 R Axis:   -62 Text Interpretation:  Normal sinus rhythm Left anterior fascicular block  Septal infarct , age undetermined Abnormal ECG No significant change since  last tracing Confirmed by KNAPP  MD-J, JON ([id number]) on 11/05/2014 11:31:23  PM       MDM   Nursing notes and vitals signs, including pulse oximetry, reviewed.  Summary of this visit's results, reviewed by myself:  Labs:  Results for orders placed or performed during the hospital encounter of 11/05/14 (from the past 24 hour(s))  Basic metabolic panel     Status: Abnormal   Collection Time: 11/05/14 11:45 PM  Result Value Ref Range   Sodium 138 135 - 145 mmol/L   Potassium 3.8 3.5 - 5.1 mmol/L   Chloride 101 101 - 111 mmol/L   CO2 28 22 - 32 mmol/L   Glucose, Bld 164 (H) 65 - 99 mg/dL   BUN 13 6 - 20 mg/dL   Creatinine, Ser 6.04 0.44 - 1.00 mg/dL   Calcium 9.5 8.9 - 54.0 mg/dL   GFR calc non Af Amer >60 >60 mL/min   GFR calc Af Amer >60 >60 mL/min   Anion gap 9 5 - 15  CBC     Status: None   Collection Time: 11/05/14 11:45 PM  Result Value Ref Range   WBC 6.9 4.0 - 10.5 K/uL   RBC 4.92 3.87 - 5.11 MIL/uL   Hemoglobin 14.1 12.0 - 15.0 g/dL   HCT 98.1 19.1 - 47.8 %   MCV 84.6 78.0 - 100.0 fL   MCH 28.7 26.0 - 34.0 pg   MCHC 33.9 30.0 - 36.0 g/dL   RDW 29.5 62.1 - 30.8 %   Platelets 334 150 - 400 K/uL  Troponin I     Status: None   Collection Time: 11/05/14 11:45 PM  Result Value Ref Range   Troponin I <0.03 <0.031 ng/mL  Troponin I     Status: None   Collection Time: 11/06/14  2:45 AM  Result Value Ref Range   Troponin I <0.03 <0.031 ng/mL    Imaging Studies: Dg Chest 2 View  11/05/2014  CLINICAL DATA:  Chest pressure EXAM: CHEST  2 VIEW COMPARISON:  October 10, 2013 FINDINGS: The lungs are clear. The heart size and pulmonary vascularity are normal. No adenopathy. No pneumothorax. No bone lesions. There are surgical clips in the lower neck region. There are also surgical clips in the right axillary region. IMPRESSION: No edema or consolidation. Electronically Signed   By: Bretta Bang III M.D.   On: 11/05/2014 23:42   3:32 AM Pain improved with ibuprofen; just still tender.    Paula Libra, MD 11/06/14 [phone]

## 2015-04-12 ENCOUNTER — Emergency Department (HOSPITAL_BASED_OUTPATIENT_CLINIC_OR_DEPARTMENT_OTHER)
Admission: EM | Admit: 2015-04-12 | Discharge: 2015-04-12 | Disposition: A | Discharge status: Home / Self Care | Payer: BC Managed Care – PPO | Attending: Emergency Medicine | Admitting: Emergency Medicine

## 2015-04-12 ENCOUNTER — Encounter (HOSPITAL_BASED_OUTPATIENT_CLINIC_OR_DEPARTMENT_OTHER): Payer: Self-pay | Admitting: Emergency Medicine

## 2015-04-12 DIAGNOSIS — E1165 Type 2 diabetes mellitus with hyperglycemia: Secondary | ICD-10-CM | POA: Insufficient documentation

## 2015-04-12 DIAGNOSIS — Z79899 Other long term (current) drug therapy: Secondary | ICD-10-CM | POA: Insufficient documentation

## 2015-04-12 DIAGNOSIS — Z792 Long term (current) use of antibiotics: Secondary | ICD-10-CM | POA: Insufficient documentation

## 2015-04-12 DIAGNOSIS — R739 Hyperglycemia, unspecified: Secondary | ICD-10-CM

## 2015-04-12 DIAGNOSIS — Z880 Allergy status to penicillin: Secondary | ICD-10-CM | POA: Insufficient documentation

## 2015-04-12 DIAGNOSIS — I10 Essential (primary) hypertension: Secondary | ICD-10-CM | POA: Insufficient documentation

## 2015-04-12 LAB — BASIC METABOLIC PANEL
ANION GAP: 11 (ref 5–15)
BUN: 15 mg/dL (ref 6–20)
CALCIUM: 9.3 mg/dL (ref 8.9–10.3)
CO2: 24 mmol/L (ref 22–32)
Chloride: 98 mmol/L — ABNORMAL LOW (ref 101–111)
Creatinine, Ser: 0.64 mg/dL (ref 0.44–1.00)
GFR calc Af Amer: >60 mL/min (ref >60)
Glucose, Bld: 231 mg/dL — ABNORMAL HIGH (ref 65–99)
Potassium: 3.8 mmol/L (ref 3.5–5.1)
Sodium: 133 mmol/L — ABNORMAL LOW (ref 135–145)

## 2015-04-12 LAB — URINALYSIS, ROUTINE W REFLEX MICROSCOPIC
Bilirubin Urine: NEGATIVE
KETONES UR: NEGATIVE mg/dL
Leukocytes, UA: NEGATIVE
Nitrite: NEGATIVE
PH: 5.5 (ref 5.0–8.0)
Protein, ur: NEGATIVE mg/dL
Specific Gravity, Urine: 1.039 — ABNORMAL HIGH (ref 1.005–1.030)

## 2015-04-12 LAB — CBC WITH DIFFERENTIAL/PLATELET
BASOS ABS: 0 10*3/uL (ref 0.0–0.1)
Basophils Relative: 0 %
EOS PCT: 2 %
Eosinophils Absolute: 0.1 10*3/uL (ref 0.0–0.7)
HCT: 40 % (ref 36.0–46.0)
Hemoglobin: 13.8 g/dL (ref 12.0–15.0)
LYMPHS PCT: 45 %
Lymphs Abs: 3.3 10*3/uL (ref 0.7–4.0)
MCH: 29.6 pg (ref 26.0–34.0)
MCHC: 34.5 g/dL (ref 30.0–36.0)
MCV: 85.8 fL (ref 78.0–100.0)
MONO ABS: 0.6 10*3/uL (ref 0.1–1.0)
MONOS PCT: 9 %
Neutro Abs: 3.2 10*3/uL (ref 1.7–7.7)
Neutrophils Relative %: 44 %
PLATELETS: 342 10*3/uL (ref 150–400)
RBC: 4.66 MIL/uL (ref 3.87–5.11)
RDW: 12.8 % (ref 11.5–15.5)
WBC: 7.3 10*3/uL (ref 4.0–10.5)

## 2015-04-12 LAB — URINE MICROSCOPIC-ADD ON: WBC, UA: NONE SEEN WBC/hpf (ref 0–5)

## 2015-04-12 LAB — CBG MONITORING, ED: GLUCOSE-CAPILLARY: 220 mg/dL — AB (ref 65–99)

## 2015-04-12 MED ORDER — ONDANSETRON HCL 4 MG PO TABS: 4.0000 mg | ORAL_TABLET | Freq: Four times a day (QID) | ORAL | Status: DC

## 2015-04-12 MED ORDER — AZITHROMYCIN 250 MG PO TABS: 250.0000 mg | ORAL_TABLET | Freq: Every day | ORAL | Status: DC

## 2015-04-12 MED ORDER — FLUCONAZOLE 150 MG PO TABS: 150.0000 mg | ORAL_TABLET | Freq: Every day | ORAL | Status: DC

## 2015-04-12 MED FILL — FLUCONAZOLE 150 MG TABLET: 150 | 1 days supply | Qty: 1 | Fill #0

## 2015-04-12 MED FILL — ONDANSETRON HCL 4 MG TABLET: 4 | 3 days supply | Qty: 12 | Fill #0

## 2015-04-12 MED FILL — AZITHROMYCIN 250 MG TABLET: 250 | 5 days supply | Qty: 6 | Fill #0

## 2015-04-12 NOTE — ED Notes (Signed)
Pt states she was at work earlier today and felt like her sugar was high. Checked at school and it was 368. Came to ED. Also c/o vaginal d/c-thinks she has a yeast infection and sinus issues.

## 2015-04-12 NOTE — ED Provider Notes (Signed)
CSN: [id number]     Arrival date & time 04/12/15  1458 History   First MD Initiated Contact with Patient 04/12/15 [id number]     Chief Complaint  Patient presents with  . Hyperglycemia     (Consider location/radiation/quality/duration/timing/severity/associated sxs/prior Treatment) HPI Comments: Patient presents today with a chief complaint of hyperglycemia.  She states that her blood sugar has been running around 300 over the past couple of days.  She is currently on Metformin and Jardiance for her DM and states that she has been compliant with both of these medications.  She also reports bilateral blurred vision, but no visual field defects. She is also complaining of dysuria, vaginal itching, and increased urinary frequency.  She states that symptoms are similar to when she has had a UTI and Vaginal Candidiasis in the past.  She denies vaginal discharge.  She also reports associated nausea, but denies vomiting.  Denies abdominal pain, fever, chills, flank pain, or any other symptoms at this time.    The history is provided by the patient.    Past Medical History  Diagnosis Date  . Diabetes mellitus   . Hypertension   . Allergic reaction    Past Surgical History  Procedure Laterality Date  . Thyroidectomy     History reviewed. No pertinent family history. Social History  Substance Use Topics  . Smoking status: Never Smoker   . Smokeless tobacco: None  . Alcohol Use: No   OB History    No data available     Review of Systems  All other systems reviewed and are negative.     Allergies  Aspirin; Codeine; and Penicillins  Home Medications   Prior to Admission medications   Medication Sig Start Date End Date Taking? Authorizing Provider  empagliflozin (JARDIANCE) 10 MG TABS tablet Take 10 mg by mouth daily.   Yes Historical Provider, MD  famotidine (PEPCID) 20 MG tablet Take 1 tablet (20 mg total) by mouth daily. 12/09/12   Susy Frizzleharles Sheldon, MD  ibuprofen (ADVIL,MOTRIN) 800 MG  tablet Take 1 tablet (800 mg total) by mouth every 8 (eight) hours as needed (for chest wall pain). 11/06/14   John Molpus, MD  levothyroxine (SYNTHROID, LEVOTHROID) 100 MCG tablet Take 100 mcg by mouth daily.      Historical Provider, MD  Liraglutide (VICTOZA [place]) Inject into the skin.    Historical Provider, MD  losartan (COZAAR) 50 MG tablet Take 50 mg by mouth daily.    Historical Provider, MD  metFORMIN (GLUMETZA) 1000 MG (MOD) 24 hr tablet Take 1,000 mg by mouth daily with breakfast.      Historical Provider, MD  metroNIDAZOLE (FLAGYL) 500 MG tablet Take 1 tablet (500 mg total) by mouth 3 (three) times daily. 12/16/13   Jennifer Piepenbrink, PA-C  sulfamethoxazole-trimethoprim (SEPTRA DS) 800-160 MG per tablet Take 1 tablet by mouth every 12 (twelve) hours. 12/16/13   Jennifer Piepenbrink, PA-C   BP 113/67 mmHg  Pulse 84  Temp(Src) 98.4 F (36.9 C) (Oral)  Resp 18  Ht 5\' 4"  (1.626 m)  Wt 69.854 kg  BMI 26.42 kg/m2  SpO2 100% Physical Exam  Constitutional: She appears well-developed and well-nourished.  HENT:  Head: Normocephalic and atraumatic.  Nose: Right sinus exhibits maxillary sinus tenderness. Left sinus exhibits maxillary sinus tenderness.  Mouth/Throat: Oropharynx is clear and moist.  Eyes: EOM are normal. Pupils are equal, round, and reactive to light.  Neck: Normal range of motion. Neck supple.  Cardiovascular: Normal rate, regular rhythm and normal  heart sounds.   Pulmonary/Chest: Effort normal and breath sounds normal.  Abdominal: Soft. Bowel sounds are normal. She exhibits no distension and no mass. There is no tenderness. There is no rebound and no guarding.  Genitourinary:  Patient deferred  Musculoskeletal: Normal range of motion.  Neurological: She is alert. She has normal strength. No cranial nerve deficit or sensory deficit. Coordination and gait normal.  Skin: Skin is warm and dry.  Psychiatric: She has a normal mood and affect.  Nursing note and vitals  reviewed.   ED Course  Procedures (including critical care time) Labs Review Labs Reviewed  URINALYSIS, ROUTINE W REFLEX MICROSCOPIC (NOT AT [facility]) - Abnormal; Notable for the following:    Specific Gravity, Urine 1.039 (*)    Glucose, UA >1000 (*)    Hgb urine dipstick SMALL (*)    All other components within normal limits  BASIC METABOLIC PANEL - Abnormal; Notable for the following:    Sodium 133 (*)    Chloride 98 (*)    Glucose, Bld 231 (*)    All other components within normal limits  URINE MICROSCOPIC-ADD ON - Abnormal; Notable for the following:    Squamous Epithelial / LPF 0-5 (*)    Bacteria, UA RARE (*)    All other components within normal limits  CBG MONITORING, ED - Abnormal; Notable for the following:    Glucose-Capillary 220 (*)    All other components within normal limits  CBC WITH DIFFERENTIAL/PLATELET    Imaging Review No results found. I have personally reviewed and evaluated these images and lab results as part of my medical decision-making.   EKG Interpretation None      MDM   Final diagnoses:  None   Patient presents today with a chief complaint of hyperglycemia over the past couple of days.  Blood sugar found to be elevated at 220, but no evidence of DKA.  No ketones in the urine and Anion gap of 11.  She is complaining of intermittent blurred vision of both eyes.  No visual field defect.  Normal neurological exam.  Therefore, do not feel that any imaging is indicated.  She is also complaining of dysuria, vaginal itching, and increased urinary frequency.  UA negative for infection.  Patient declined pelvic exam. Patient given Rx for Diflucan 150 mg based on symptoms.   Feel that the patient is stable for discharge.  Return precautions given.      Santiago Glad, PA-C 04/13/15 1532  Lavera Guise, MD 04/14/15 [phone]

## 2015-04-12 NOTE — ED Notes (Signed)
PA at bedside.

## 2015-04-12 NOTE — ED Notes (Addendum)
Patient states that her blood sugar has been running high today up to over 350. Reports that she is having dizziness and blurred vision. Patient also reports that she is having problems with a possible yeast infection and urinary tract infections. She reports that she has had a high blood sugar since Friday.

## 2016-02-04 ENCOUNTER — Encounter (HOSPITAL_BASED_OUTPATIENT_CLINIC_OR_DEPARTMENT_OTHER): Payer: Self-pay | Admitting: *Deleted

## 2016-02-04 ENCOUNTER — Emergency Department (HOSPITAL_BASED_OUTPATIENT_CLINIC_OR_DEPARTMENT_OTHER): Payer: BC Managed Care – PPO

## 2016-02-04 ENCOUNTER — Emergency Department (HOSPITAL_BASED_OUTPATIENT_CLINIC_OR_DEPARTMENT_OTHER)
Admission: EM | Admit: 2016-02-04 | Discharge: 2016-02-04 | Disposition: A | Discharge status: Home / Self Care | Payer: BC Managed Care – PPO | Attending: Emergency Medicine | Admitting: Emergency Medicine

## 2016-02-04 DIAGNOSIS — I10 Essential (primary) hypertension: Secondary | ICD-10-CM | POA: Diagnosis not present

## 2016-02-04 DIAGNOSIS — Z79899 Other long term (current) drug therapy: Secondary | ICD-10-CM | POA: Insufficient documentation

## 2016-02-04 DIAGNOSIS — E119 Type 2 diabetes mellitus without complications: Secondary | ICD-10-CM | POA: Insufficient documentation

## 2016-02-04 DIAGNOSIS — Z7984 Long term (current) use of oral hypoglycemic drugs: Secondary | ICD-10-CM | POA: Insufficient documentation

## 2016-02-04 DIAGNOSIS — R0602 Shortness of breath: Secondary | ICD-10-CM | POA: Diagnosis present

## 2016-02-04 DIAGNOSIS — R51 Headache: Secondary | ICD-10-CM | POA: Insufficient documentation

## 2016-02-04 DIAGNOSIS — J45901 Unspecified asthma with (acute) exacerbation: Secondary | ICD-10-CM | POA: Diagnosis not present

## 2016-02-04 DIAGNOSIS — R509 Fever, unspecified: Secondary | ICD-10-CM | POA: Insufficient documentation

## 2016-02-04 LAB — COMPREHENSIVE METABOLIC PANEL
ALK PHOS: 49 U/L (ref 38–126)
ALT: 15 U/L (ref 14–54)
AST: 26 U/L (ref 15–41)
Albumin: 4.1 g/dL (ref 3.5–5.0)
Anion gap: 10 (ref 5–15)
BUN: 8 mg/dL (ref 6–20)
CHLORIDE: 105 mmol/L (ref 101–111)
CO2: 24 mmol/L (ref 22–32)
CREATININE: 0.63 mg/dL (ref 0.44–1.00)
Calcium: 8.7 mg/dL — ABNORMAL LOW (ref 8.9–10.3)
GFR calc Af Amer: >60 mL/min (ref >60)
Glucose, Bld: 130 mg/dL — ABNORMAL HIGH (ref 65–99)
Potassium: 3.3 mmol/L — ABNORMAL LOW (ref 3.5–5.1)
Sodium: 139 mmol/L (ref 135–145)
Total Bilirubin: 0.3 mg/dL (ref 0.3–1.2)
Total Protein: 8.3 g/dL — ABNORMAL HIGH (ref 6.5–8.1)

## 2016-02-04 LAB — CBC WITH DIFFERENTIAL/PLATELET
Basophils Absolute: 0 10*3/uL (ref 0.0–0.1)
Basophils Relative: 0 %
Eosinophils Absolute: 0.2 10*3/uL (ref 0.0–0.7)
Eosinophils Relative: 3 %
HCT: 41.1 % (ref 36.0–46.0)
Hemoglobin: 13.9 g/dL (ref 12.0–15.0)
LYMPHS ABS: 2.9 10*3/uL (ref 0.7–4.0)
LYMPHS PCT: 63 %
MCH: 29.1 pg (ref 26.0–34.0)
MCHC: 33.8 g/dL (ref 30.0–36.0)
MCV: 86.2 fL (ref 78.0–100.0)
Monocytes Absolute: 0.3 10*3/uL (ref 0.1–1.0)
Monocytes Relative: 6 %
NEUTROS ABS: 1.2 10*3/uL — AB (ref 1.7–7.7)
NEUTROS PCT: 28 %
PLATELETS: 305 10*3/uL (ref 150–400)
RBC: 4.77 MIL/uL (ref 3.87–5.11)
RDW: 13.3 % (ref 11.5–15.5)
WBC: 4.5 10*3/uL (ref 4.0–10.5)

## 2016-02-04 LAB — CBG MONITORING, ED: Glucose-Capillary: 87 mg/dL (ref 65–99)

## 2016-02-04 LAB — BRAIN NATRIURETIC PEPTIDE: B Natriuretic Peptide: 11.6 pg/mL (ref 0.0–100.0)

## 2016-02-04 LAB — TROPONIN I

## 2016-02-04 MED ORDER — IPRATROPIUM-ALBUTEROL 0.5-2.5 (3) MG/3ML IN SOLN
3.0000 mL | Freq: Once | RESPIRATORY_TRACT | Status: AC
  Administered 2016-02-04: 3 mL via RESPIRATORY_TRACT
  Filled 2016-02-04: qty 3

## 2016-02-04 MED ORDER — PREDNISONE 20 MG PO TABS: ORAL_TABLET | ORAL | 0 refills | Status: DC

## 2016-02-04 MED ORDER — AZITHROMYCIN 250 MG PO TABS: 250.0000 mg | ORAL_TABLET | Freq: Every day | ORAL | 0 refills | Status: DC

## 2016-02-04 MED ORDER — ALBUTEROL SULFATE (2.5 MG/3ML) 0.083% IN NEBU
2.5000 mg | INHALATION_SOLUTION | Freq: Once | RESPIRATORY_TRACT | Status: AC
  Administered 2016-02-04: 2.5 mg via RESPIRATORY_TRACT
  Filled 2016-02-04: qty 3

## 2016-02-04 MED ORDER — ALBUTEROL SULFATE HFA 108 (90 BASE) MCG/ACT IN AERS
INHALATION_SPRAY | RESPIRATORY_TRACT | Status: AC
  Administered 2016-02-04: 4
  Filled 2016-02-04: qty 6.7

## 2016-02-04 MED ORDER — AZITHROMYCIN 250 MG PO TABS
500.0000 mg | ORAL_TABLET | Freq: Once | ORAL | Status: AC
  Administered 2016-02-04: 500 mg via ORAL
  Filled 2016-02-04: qty 2

## 2016-02-04 MED ORDER — ALBUTEROL SULFATE HFA 108 (90 BASE) MCG/ACT IN AERS
4.0000 | INHALATION_SPRAY | RESPIRATORY_TRACT | 0 refills | Status: DC | PRN
Start: 2016-02-04 — End: 2020-04-12

## 2016-02-04 NOTE — ED Notes (Signed)
Patient transported to X-ray 

## 2016-02-04 NOTE — ED Triage Notes (Signed)
Pt reports productive cough, chest pain, sob since Tuesday. Denies sinus congestion/drainage, sore throat.

## 2016-02-04 NOTE — ED Notes (Signed)
ambulated in hall on r/a, HR 99-105, RR 18-24, SpO2 99-100%, BBS exp wheezes t/o.

## 2016-02-04 NOTE — ED Provider Notes (Signed)
[facility] Provider Note   CSN: [id number] Arrival date & time: 02/04/16  1046     History   Chief Complaint Chief Complaint  Patient presents with  . Shortness of Breath    HPI [name] is a 51 y.o. female.   Shortness of Breath  This is a new problem. The average episode lasts 4 days. The problem occurs continuously.The current episode started more than 2 days ago. The problem has been gradually worsening. Associated symptoms include a fever, headaches, coryza, rhinorrhea, sore throat, cough, sputum production and wheezing. Pertinent negatives include no leg pain and no leg swelling. Associated medical issues include asthma.    Past Medical History:  Diagnosis Date  . Allergic reaction   . Diabetes mellitus   . Hypertension     Patient Active Problem List   Diagnosis Date Noted  . ASTHMA 10/28/2006  . HYPERSOMNIA 10/28/2006    Past Surgical History:  Procedure Laterality Date  . THYROIDECTOMY    . TONSILLECTOMY    . TUBAL LIGATION  1990  . uterine ablation  2008    OB History    No data available       Home Medications    Prior to Admission medications   Medication Sig Start Date End Date Taking? Authorizing Provider  atorvastatin (LIPITOR) 10 MG tablet Take 10 mg by mouth daily.   Yes Historical Provider, MD  Dulaglutide (TRULICITY [place]) Inject into the skin.   Yes Historical Provider, MD  empagliflozin (JARDIANCE) 10 MG TABS tablet Take 10 mg by mouth daily.   Yes Historical Provider, MD  levothyroxine (SYNTHROID, LEVOTHROID) 100 MCG tablet Take 100 mcg by mouth daily.     Yes Historical Provider, MD  losartan (COZAAR) 50 MG tablet Take 50 mg by mouth daily.   Yes Historical Provider, MD  metFORMIN (GLUMETZA) 1000 MG (MOD) 24 hr tablet Take 1,000 mg by mouth daily with breakfast.     Yes Historical Provider, MD  nortriptyline (PAMELOR) 75 MG capsule Take 75 mg by mouth 3 (three) times daily.   Yes Historical Provider, MD  pregabalin  (LYRICA) 100 MG capsule Take 100 mg by mouth 2 (two) times daily.   Yes Historical Provider, MD  albuterol (PROVENTIL HFA;VENTOLIN HFA) 108 (90 Base) MCG/ACT inhaler Inhale 4 puffs into the lungs every 4 (four) hours as needed for wheezing or shortness of breath. 02/04/16   Marily Memos, MD  azithromycin (ZITHROMAX) 250 MG tablet Take 1 tablet (250 mg total) by mouth daily. Take 1 every day until finished. 02/04/16   Marily Memos, MD  predniSONE (DELTASONE) 20 MG tablet 2 tabs po daily x 4 days 02/04/16   Marily Memos, MD    Family History No family history on file.  Social History Social History  Substance Use Topics  . Smoking status: Never Smoker  . Smokeless tobacco: Never Used  . Alcohol use No     Allergies   Aspirin; Codeine; and Penicillins   Review of Systems Review of Systems  Constitutional: Positive for chills, fatigue and fever.  HENT: Positive for rhinorrhea and sore throat.   Respiratory: Positive for cough, sputum production, shortness of breath and wheezing.   Cardiovascular: Negative for leg swelling.  Neurological: Positive for headaches.  All other systems reviewed and are negative.    Physical Exam Updated Vital Signs BP 143/68 (BP Location: Right Arm)   Pulse 109   Temp 98.6 F (37 C) (Oral)   Resp 16   SpO2  100%   Physical Exam  Constitutional: She is oriented to person, place, and time. She appears well-developed and well-nourished.  HENT:  Head: Normocephalic and atraumatic.  Eyes: Conjunctivae and EOM are normal.  Neck: Normal range of motion.  Cardiovascular: Normal rate and regular rhythm.   Pulmonary/Chest: No stridor. No respiratory distress. She has wheezes. She has rales.  Abdominal: She exhibits no distension.  Neurological: She is alert and oriented to person, place, and time. No cranial nerve deficit. Coordination normal.  Nursing note and vitals reviewed.    ED Treatments / Results  Labs (all labs ordered are listed, but only  abnormal results are displayed) Labs Reviewed  CBC WITH DIFFERENTIAL/PLATELET - Abnormal; Notable for the following:       Result Value   Neutro Abs 1.2 (*)    All other components within normal limits  COMPREHENSIVE METABOLIC PANEL - Abnormal; Notable for the following:    Potassium 3.3 (*)    Glucose, Bld 130 (*)    Calcium 8.7 (*)    Total Protein 8.3 (*)    All other components within normal limits  TROPONIN I  BRAIN NATRIURETIC PEPTIDE  CBG MONITORING, ED    EKG  EKG Interpretation None       Radiology Dg Chest 2 View  Result Date: 02/04/2016 CLINICAL DATA:  Cough, chest pain wheezing shortness of breath for 4 days. EXAM: CHEST  2 VIEW COMPARISON:  11/05/2014 and prior chest radiographs FINDINGS: The cardiomediastinal silhouette is unremarkable. Mild peribronchial thickening is unchanged as well surgical clips within the neck and right axilla. There is no evidence of focal airspace disease, pulmonary edema, suspicious pulmonary nodule/mass, pleural effusion, or pneumothorax. No acute bony abnormalities are identified. IMPRESSION: No active cardiopulmonary disease. Electronically Signed   By: Harmon Pier M.D.   On: 02/04/2016 12:59    Procedures Procedures (including critical care time)  Medications Ordered in ED Medications  ipratropium-albuterol (DUONEB) 0.5-2.5 (3) MG/3ML nebulizer solution 3 mL (3 mLs Nebulization Given 02/04/16 1107)  albuterol (PROVENTIL) (2.5 MG/3ML) 0.083% nebulizer solution 2.5 mg (2.5 mg Nebulization Given 02/04/16 1107)  azithromycin (ZITHROMAX) tablet 500 mg (500 mg Oral Given 02/04/16 1433)  albuterol (PROVENTIL HFA;VENTOLIN HFA) 108 (90 Base) MCG/ACT inhaler (4 puffs  Given 02/04/16 1419)  albuterol (PROVENTIL) (2.5 MG/3ML) 0.083% nebulizer solution 2.5 mg (2.5 mg Nebulization Given by Other 02/04/16 1433)  ipratropium-albuterol (DUONEB) 0.5-2.5 (3) MG/3ML nebulizer solution 3 mL (3 mLs Nebulization Given by Other 02/04/16 1433)     Initial  Impression / Assessment and Plan / ED Course  I have reviewed the triage vital signs and the nursing notes.  Pertinent labs & imaging results that were available during my care of the patient were reviewed by me and considered in my medical decision making (see chart for details).     Here with likely asthma exacerbation exacerbated by infection. Patient able ambulate with some shortness of breath but no significant drops in her saturations. No extra distress. We gave her a couple DuoNeb's and had significant improvement with that. Her lungs opened up well. She did have some crackles that are asymmetric so she was started on azithromycin. Plan for discharge and PCP follow-up. Return here for any new or worsening symptoms.  Final Clinical Impressions(s) / ED Diagnoses   Final diagnoses:  Exacerbation of asthma, unspecified asthma severity, unspecified whether persistent    New Prescriptions Discharge Medication List as of 02/04/2016  3:15 PM    START taking these medications  Details  albuterol (PROVENTIL HFA;VENTOLIN HFA) 108 (90 Base) MCG/ACT inhaler Inhale 4 puffs into the lungs every 4 (four) hours as needed for wheezing or shortness of breath., Starting Sat 02/04/2016, Print    predniSONE (DELTASONE) 20 MG tablet 2 tabs po daily x 4 days, Print         Marily MemosJason Cem Kosman, MD 02/04/16 1550

## 2018-07-28 ENCOUNTER — Other Ambulatory Visit: Payer: Self-pay

## 2018-07-28 ENCOUNTER — Emergency Department (HOSPITAL_BASED_OUTPATIENT_CLINIC_OR_DEPARTMENT_OTHER): Payer: BC Managed Care – PPO

## 2018-07-28 ENCOUNTER — Emergency Department (HOSPITAL_BASED_OUTPATIENT_CLINIC_OR_DEPARTMENT_OTHER)
Admission: EM | Admit: 2018-07-28 | Discharge: 2018-07-28 | Disposition: A | Discharge status: Home / Self Care | Payer: BC Managed Care – PPO | Attending: Emergency Medicine | Admitting: Emergency Medicine

## 2018-07-28 ENCOUNTER — Encounter (HOSPITAL_BASED_OUTPATIENT_CLINIC_OR_DEPARTMENT_OTHER): Payer: Self-pay

## 2018-07-28 DIAGNOSIS — J45909 Unspecified asthma, uncomplicated: Secondary | ICD-10-CM | POA: Diagnosis not present

## 2018-07-28 DIAGNOSIS — M25512 Pain in left shoulder: Secondary | ICD-10-CM | POA: Insufficient documentation

## 2018-07-28 DIAGNOSIS — Z7984 Long term (current) use of oral hypoglycemic drugs: Secondary | ICD-10-CM | POA: Diagnosis not present

## 2018-07-28 DIAGNOSIS — Z79899 Other long term (current) drug therapy: Secondary | ICD-10-CM | POA: Diagnosis not present

## 2018-07-28 DIAGNOSIS — E119 Type 2 diabetes mellitus without complications: Secondary | ICD-10-CM | POA: Diagnosis not present

## 2018-07-28 DIAGNOSIS — I10 Essential (primary) hypertension: Secondary | ICD-10-CM | POA: Diagnosis not present

## 2018-07-28 MED ORDER — IBUPROFEN 800 MG PO TABS
800.0000 mg | ORAL_TABLET | Freq: Once | ORAL | Status: AC
  Administered 2018-07-28: 800 mg via ORAL

## 2018-07-28 MED ORDER — HYDROCODONE-ACETAMINOPHEN 5-325 MG PO TABS: 1.0000 | ORAL_TABLET | ORAL | 0 refills | Status: DC | PRN

## 2018-07-28 MED ORDER — IBUPROFEN 800 MG PO TABS
ORAL_TABLET | ORAL | Status: AC
  Filled 2018-07-28: qty 1

## 2018-07-28 MED ORDER — PREDNISONE 20 MG PO TABS: ORAL_TABLET | ORAL | 0 refills | Status: DC

## 2018-07-28 NOTE — ED Triage Notes (Signed)
Pt c/o pain to left shoulder x 2-3 days-denies injury-pain worse with movement-NAD-steady gait

## 2018-07-28 NOTE — ED Provider Notes (Signed)
[facility] EMERGENCY DEPARTMENT Provider Note   CSN: [id number] Arrival date & time: 07/28/18  1913     History   Chief Complaint Chief Complaint  Patient presents with  . Shoulder Pain    HPI [name] is a 53 y.o. female.     Patient is a 53 year old female with a history of diabetes and hypertension who presents with left shoulder pain.  She states that she has had a 2 to 3-day history of pain in her left shoulder.  It is gotten gradually worse.  It hurts when she moves it.  She denies any radiation down her arm.  No numbness or weakness in her arm or hand.  No known injuries.  She states it happened 1 time in the past but it got better on its own.  She has been using over-the-counter medicines without improvement in symptoms.     Past Medical History:  Diagnosis Date  . Allergic reaction   . Diabetes mellitus   . Hypertension     Patient Active Problem List   Diagnosis Date Noted  . ASTHMA 10/28/2006  . HYPERSOMNIA 10/28/2006    Past Surgical History:  Procedure Laterality Date  . THYROIDECTOMY    . TONSILLECTOMY    . TUBAL LIGATION  1990  . uterine ablation  2008     OB History   No obstetric history on file.      Home Medications    Prior to Admission medications   Medication Sig Start Date End Date Taking? Authorizing Provider  albuterol (PROVENTIL HFA;VENTOLIN HFA) 108 (90 Base) MCG/ACT inhaler Inhale 4 puffs into the lungs every 4 (four) hours as needed for wheezing or shortness of breath. 02/04/16   Mesner, Barbara CowerJason, MD  atorvastatin (LIPITOR) 10 MG tablet Take 10 mg by mouth daily.    [provider]  azithromycin (ZITHROMAX) 250 MG tablet Take 1 tablet (250 mg total) by mouth daily. Take 1 every day until finished. 02/04/16   Mesner, Barbara CowerJason, MD  Dulaglutide (TRULICITY [place]) Inject into the skin.    [provider]  empagliflozin (JARDIANCE) 10 MG TABS tablet Take 10 mg by mouth daily.    [provider]   HYDROcodone-acetaminophen (NORCO/VICODIN) 5-325 MG tablet Take 1-2 tablets by mouth every 4 (four) hours as needed. 07/28/18   Rolan BuccoBelfi, Tichina Koebel, MD  levothyroxine (SYNTHROID, LEVOTHROID) 100 MCG tablet Take 100 mcg by mouth daily.      [provider]  losartan (COZAAR) 50 MG tablet Take 50 mg by mouth daily.    [provider]  metFORMIN (GLUMETZA) 1000 MG (MOD) 24 hr tablet Take 1,000 mg by mouth daily with breakfast.      [provider]  nortriptyline (PAMELOR) 75 MG capsule Take 75 mg by mouth 3 (three) times daily.    [provider]  predniSONE (DELTASONE) 20 MG tablet 3 tabs po day one, then 2 po daily x 4 days 07/28/18   Rolan BuccoBelfi, Bert Givans, MD  pregabalin (LYRICA) 100 MG capsule Take 100 mg by mouth 2 (two) times daily.    [provider]  simvastatin (ZOCOR) 10 MG tablet Take 10 mg by mouth at bedtime.    12/09/12  [provider]  sitaGLIPtin (JANUVIA) 25 MG tablet Take 25 mg by mouth daily.    12/09/12  [provider]    Family History No family history on file.  Social History Social History   Tobacco Use  . Smoking status: Never Smoker  .  Smokeless tobacco: Never Used  Substance Use Topics  . Alcohol use: No  . Drug use: No     Allergies   Aspirin, Codeine, Lisinopril, and Penicillins   Review of Systems Review of Systems  Constitutional: Negative for fever.  Gastrointestinal: Negative for nausea and vomiting.  Musculoskeletal: Positive for arthralgias. Negative for back pain, joint swelling and neck pain.  Skin: Negative for wound.  Neurological: Negative for weakness, numbness and headaches.     Physical Exam Updated Vital Signs BP 140/79 (BP Location: Left Arm)   Pulse 80   Temp 98.1 F (36.7 C) (Oral)   Resp 18   Ht 5\' 4"  (1.626 m)   Wt 70.3 kg   SpO2 100%   BMI 26.61 kg/m   Physical Exam Constitutional:      Appearance: She is well-developed.  HENT:     Head: Normocephalic and  atraumatic.  Neck:     Musculoskeletal: Normal range of motion and neck supple.  Cardiovascular:     Rate and Rhythm: Normal rate.  Pulmonary:     Effort: Pulmonary effort is normal.  Musculoskeletal:        General: Tenderness present.     Comments: Patient has tenderness to the left shoulder.  She has some tenderness to palpation of the [facility] joint and also over the biceps tendon insertion area.  There is no significant pain on range of motion the joint.  She has normal sensation and motor function distally in the hand.  Radial pulses are intact.  There is no swelling of the joint.  No warmth or erythema.  Skin:    General: Skin is warm and dry.  Neurological:     Mental Status: She is alert and oriented to person, place, and time.      ED Treatments / Results  Labs (all labs ordered are listed, but only abnormal results are displayed) Labs Reviewed - No data to display  EKG None  Radiology Dg Shoulder Left  Result Date: 07/28/2018 CLINICAL DATA:  Left shoulder pain with painful range of motion for 2-3 days. No known injury. EXAM: LEFT SHOULDER - 2+ VIEW COMPARISON:  02/12/2010 FINDINGS: There is no fracture or dislocation. Marginal osteophyte on the inferior rim of the glenoid. AC joint is normal. Soft tissues are normal. IMPRESSION: No acute abnormality. Slight arthritic changes of the glenohumeral joint. Electronically Signed   By: Lorriane Shire M.D.   On: 07/28/2018 20:27    Procedures Procedures (including critical care time)  Medications Ordered in ED Medications  ibuprofen (ADVIL) 800 MG tablet (has no administration in time range)  ibuprofen (ADVIL) tablet 800 mg (800 mg Oral Given 07/28/18 2146)     Initial Impression / Assessment and Plan / ED Course  I have reviewed the triage vital signs and the nursing notes.  Pertinent labs & imaging results that were available during my care of the patient were reviewed by me and considered in my medical decision making (see  chart for details).        Presents with worsening left shoulder pain.  She is neurologically intact.  X-ray shows some arthritic changes but otherwise unremarkable.  I talked to her about options and at this point we will go ahead and start her on a short course of prednisone.  She is diabetic but she says her sugars are well controlled.  I advised her to keep a close check on her blood sugars.  She was given a short course of Vicodin.  She was given a referral to follow-up with Dr. Pearletha ForgeHudnall.  Return precautions were given.  Final Clinical Impressions(s) / ED Diagnoses   Final diagnoses:  Acute pain of left shoulder    ED Discharge Orders         Ordered    predniSONE (DELTASONE) 20 MG tablet     07/28/18 2225    HYDROcodone-acetaminophen (NORCO/VICODIN) 5-325 MG tablet  Every 4 hours PRN     07/28/18 2225           Rolan BuccoBelfi, Jakirah Zaun, MD 07/28/18 2228

## 2018-07-29 ENCOUNTER — Ambulatory Visit: Payer: BC Managed Care – PPO | Admitting: Family Medicine

## 2018-07-29 ENCOUNTER — Ambulatory Visit: Payer: Self-pay

## 2018-07-29 ENCOUNTER — Encounter: Payer: Self-pay | Admitting: Family Medicine

## 2018-07-29 VITALS — Ht 64.0 in | Wt 155.0 lb

## 2018-07-29 DIAGNOSIS — M25512 Pain in left shoulder: Secondary | ICD-10-CM

## 2018-07-29 DIAGNOSIS — M7552 Bursitis of left shoulder: Secondary | ICD-10-CM | POA: Diagnosis not present

## 2018-07-29 MED ORDER — METHYLPREDNISOLONE ACETATE 40 MG/ML IJ SUSP
40.0000 mg | Freq: Once | INTRAMUSCULAR | Status: AC
  Administered 2018-07-29: 40 mg via INTRA_ARTICULAR

## 2018-07-29 NOTE — Progress Notes (Signed)
[name] - 53 y.o. female MRN [id number]  Date of birth: 04/23/1965  SUBJECTIVE:  Including CC & ROS.  Chief Complaint  Patient presents with  . Shoulder Pain    left shoulder x 5 days    [name] is a 53 y.o. female that is presenting with left shoulder pain for the past 5 days.  The pain is severe and has not had improvement with medications to date.  She denies any specific injury or inciting event.  Pain is occurring with certain movements.  No prior history of surgery or injections into the area.  It is localized to the shoulder with no radiation.  Seems to be getting worse..  Independent review of the left shoulder x-ray from 7/20 shows mild degenerative glenohumeral changes   Review of Systems  Constitutional: Negative for fever.  HENT: Negative for congestion.   Respiratory: Negative for cough.   Cardiovascular: Negative for chest pain.  Gastrointestinal: Negative for abdominal pain.  Musculoskeletal: Negative for gait problem.  Skin: Negative for color change.  Neurological: Negative for weakness.  Hematological: Negative for adenopathy.    HISTORY: Past Medical, Surgical, Social, and Family History Reviewed & Updated per EMR.   Pertinent Historical Findings include:  Past Medical History:  Diagnosis Date  . Allergic reaction   . Diabetes mellitus   . Hypertension     Past Surgical History:  Procedure Laterality Date  . THYROIDECTOMY    . TONSILLECTOMY    . TUBAL LIGATION  1990  . uterine ablation  2008    Allergies  Allergen Reactions  . Aspirin   . Codeine   . Lisinopril Swelling  . Penicillins     No family history on file.   Social History   Socioeconomic History  . Marital status: Married    Spouse name: Not on file  . Number of children: Not on file  . Years of education: Not on file  . Highest education level: Not on file  Occupational History  . Not on file  Social Needs  . Financial resource strain: Not on file  .  Food insecurity    Worry: Not on file    Inability: Not on file  . Transportation needs    Medical: Not on file    Non-medical: Not on file  Tobacco Use  . Smoking status: Never Smoker  . Smokeless tobacco: Never Used  Substance and Sexual Activity  . Alcohol use: No  . Drug use: No  . Sexual activity: Not on file  Lifestyle  . Physical activity    Days per week: Not on file    Minutes per session: Not on file  . Stress: Not on file  Relationships  . Social Musicianconnections    Talks on phone: Not on file    Gets together: Not on file    Attends religious service: Not on file    Active member of club or organization: Not on file    Attends meetings of clubs or organizations: Not on file    Relationship status: Not on file  . Intimate partner violence    Fear of current or ex partner: Not on file    Emotionally abused: Not on file    Physically abused: Not on file    Forced sexual activity: Not on file  Other Topics Concern  . Not on file  Social History Narrative  . Not on file     PHYSICAL EXAM:  VS: Ht 5\' 4"  (  1.626 m)   Wt 155 lb (70.3 kg)   BMI 26.61 kg/m  Physical Exam Gen: NAD, alert, cooperative with exam, well-appearing ENT: normal lips, normal nasal mucosa,  Eye: normal EOM, normal conjunctiva and lids CV:  no edema, +2 pedal pulses   Resp: no accessory muscle use, non-labored,  GI: no masses or tenderness, no hernia  Skin: no rashes, no areas of induration  Neuro: normal tone, normal sensation to touch Psych:  normal insight, alert and oriented MSK:  Left shoulder: Normal external and internal rotation. Some pain with external rotation and abduction. Normal strength resistance with internal and external rotation. Some pain with empty can testing. Negative Hawkin's test. Negative O'Brien's test. Neurovascular intact  Limited ultrasound: Left shoulder:  Mild encircling effusion of the proximal biceps tendon. Normal-appearing subscapularis.  Supraspinatus with degenerative changes and mild bursitis.   Summary: Findings associated with possible degenerative changes of the glenohumeral joint with effusion encircling the biceps tendon and subacromial bursitis  Ultrasound and interpretation by Clearance Coots, MD   Aspiration/Injection Procedure Note [name] [birth year]  Procedure: Injection Indications: Left shoulder pain  Procedure Details Consent: Risks of procedure as well as the alternatives and risks of each were explained to the (patient/caregiver).  Consent for procedure obtained. Time Out: Verified patient identification, verified procedure, site/side was marked, verified correct patient position, special equipment/implants available, medications/allergies/relevent history reviewed, required imaging and test results available.  Performed.  The area was cleaned with iodine and alcohol swabs.    The left subacromial space was injected using 1 cc's of 40 mg Depo-Medrol and 4 cc's of 0.25% bupivacaine with a 22 1 1/2" needle.  Ultrasound was used. Images were obtained in long views showing the injection.     A sterile dressing was applied.  Patient did tolerate procedure well.      ASSESSMENT & PLAN:   Subacromial bursitis Seems to have a component of degenerative changes she has findings on ultrasound to suggest an effusion of the joint.  Clinically it seems more rotator cuff with a bursitis observed on ultrasound. -Subacromial injection. -Counseled on home exercise therapy and supportive care. -If no improvement will consider glenohumeral injection and physical therapy.

## 2018-07-29 NOTE — Patient Instructions (Signed)
Nice to meet you Please try the exercises  Please try ice on the area   Please send me a message in [facility] with any questions or updates.  Please see me back in 4 weeks.   --Dr. Raeford Razor

## 2018-07-30 DIAGNOSIS — M19019 Primary osteoarthritis, unspecified shoulder: Secondary | ICD-10-CM | POA: Insufficient documentation

## 2018-07-30 DIAGNOSIS — M67819 Other specified disorders of synovium and tendon, unspecified shoulder: Secondary | ICD-10-CM | POA: Insufficient documentation

## 2018-07-30 NOTE — Assessment & Plan Note (Addendum)
Seems to have a component of degenerative changes she has findings on ultrasound to suggest an effusion of the joint.  Clinically it seems more rotator cuff with a bursitis observed on ultrasound. -Subacromial injection. -Counseled on home exercise therapy and supportive care. -If no improvement will consider glenohumeral injection and physical therapy.

## 2018-08-26 ENCOUNTER — Other Ambulatory Visit: Payer: Self-pay

## 2018-08-26 ENCOUNTER — Ambulatory Visit (INDEPENDENT_AMBULATORY_CARE_PROVIDER_SITE_OTHER): Payer: BC Managed Care – PPO | Admitting: Family Medicine

## 2018-08-26 DIAGNOSIS — M7552 Bursitis of left shoulder: Secondary | ICD-10-CM

## 2018-08-26 MED ORDER — NITROGLYCERIN 0.2 MG/HR TD PT24: MEDICATED_PATCH | TRANSDERMAL | 11 refills | Status: DC

## 2018-08-26 NOTE — Assessment & Plan Note (Signed)
Has had significant improvement of her pain.  Still having mild pain at times. -Sent in nitroglycerin patches.  Counseled on these -Counseled on home exercise therapy and supportive care. -If no improvement consider physical therapy

## 2018-08-26 NOTE — Patient Instructions (Signed)
Good to see you  Please continue the exercises Please try the nitro patches Please send me a message in MyChart with any questions or updates.  Please see me back in 6-8 weeks.   --Dr. Raeford Razor  Nitroglycerin Protocol   Apply 1/4 nitroglycerin patch to affected area daily.  Change position of patch within the affected area every 24 hours.  You may experience a headache during the first 1-2 weeks of using the patch, these should subside.  If you experience headaches after beginning nitroglycerin patch treatment, you may take your preferred over the counter pain reliever.  Another side effect of the nitroglycerin patch is skin irritation or rash related to patch adhesive.  Please notify our office if you develop more severe headaches or rash, and stop the patch.  Tendon healing with nitroglycerin patch may require 12 to 24 weeks depending on the extent of injury.  Men should not use if taking Viagra, Cialis, or Levitra.   Do not use if you have migraines or rosacea.

## 2018-08-26 NOTE — Progress Notes (Signed)
[name] - 53 y.o. female MRN [id number]  Date of birth: 04/29/1965  SUBJECTIVE:  Including CC & ROS.  Chief Complaint  Patient presents with  . Follow-up    follow up for left shoulder    [name] is a 53 y.o. female that is is following up for her left shoulder pain.  She received a subacromial injection in the left shoulder.  Since that time she has had significant improvement of her pain.  She is able to move her arm more normally.  The pain is still there but much less severe and is occurring intermittently.  She is avoiding lifting anything heavy on that side.  Pain can be sharp at times.    Review of Systems  Constitutional: Negative for fever.  HENT: Negative for congestion.   Respiratory: Negative for cough.   Cardiovascular: Negative for chest pain.  Gastrointestinal: Negative for abdominal distention.  Musculoskeletal: Negative for back pain.  Skin: Negative for color change.  Neurological: Negative for weakness.  Hematological: Negative for adenopathy.    HISTORY: Past Medical, Surgical, Social, and Family History Reviewed & Updated per EMR.   Pertinent Historical Findings include:  Past Medical History:  Diagnosis Date  . Allergic reaction   . Diabetes mellitus   . Hypertension     Past Surgical History:  Procedure Laterality Date  . THYROIDECTOMY    . TONSILLECTOMY    . TUBAL LIGATION  1990  . uterine ablation  2008    Allergies  Allergen Reactions  . Aspirin   . Codeine   . Lisinopril Swelling  . Penicillins     No family history on file.   Social History   Socioeconomic History  . Marital status: Married    Spouse name: Not on file  . Number of children: Not on file  . Years of education: Not on file  . Highest education level: Not on file  Occupational History  . Not on file  Social Needs  . Financial resource strain: Not on file  . Food insecurity    Worry: Not on file    Inability: Not on file  . Transportation  needs    Medical: Not on file    Non-medical: Not on file  Tobacco Use  . Smoking status: Never Smoker  . Smokeless tobacco: Never Used  Substance and Sexual Activity  . Alcohol use: No  . Drug use: No  . Sexual activity: Not on file  Lifestyle  . Physical activity    Days per week: Not on file    Minutes per session: Not on file  . Stress: Not on file  Relationships  . Social Musicianconnections    Talks on phone: Not on file    Gets together: Not on file    Attends religious service: Not on file    Active member of club or organization: Not on file    Attends meetings of clubs or organizations: Not on file    Relationship status: Not on file  . Intimate partner violence    Fear of current or ex partner: Not on file    Emotionally abused: Not on file    Physically abused: Not on file    Forced sexual activity: Not on file  Other Topics Concern  . Not on file  Social History Narrative  . Not on file     PHYSICAL EXAM:  VS: BP 137/84   Pulse 88   Ht 5\' 4"  (1.626 m)  Wt 154 lb (69.9 kg)   BMI 26.43 kg/m  Physical Exam Gen: NAD, alert, cooperative with exam, well-appearing ENT: normal lips, normal nasal mucosa,  Eye: normal EOM, normal conjunctiva and lids CV:  no edema, +2 pedal pulses   Resp: no accessory muscle use, non-labored,  Skin: no rashes, no areas of induration  Neuro: normal tone, normal sensation to touch Psych:  normal insight, alert and oriented MSK:  Left shoulder: Normal range of motion. Normal strength resistance. Mild pain with empty can testing. Neurovascular intact     ASSESSMENT & PLAN:   Subacromial bursitis Has had significant improvement of her pain.  Still having mild pain at times. -Sent in nitroglycerin patches.  Counseled on these -Counseled on home exercise therapy and supportive care. -If no improvement consider physical therapy

## 2018-09-30 ENCOUNTER — Ambulatory Visit: Payer: BC Managed Care – PPO | Admitting: Family Medicine

## 2018-09-30 ENCOUNTER — Encounter: Payer: Self-pay | Admitting: Family Medicine

## 2018-09-30 ENCOUNTER — Ambulatory Visit: Payer: Self-pay

## 2018-09-30 ENCOUNTER — Other Ambulatory Visit: Payer: Self-pay

## 2018-09-30 VITALS — BP 142/84 | Ht 64.0 in

## 2018-09-30 DIAGNOSIS — M19012 Primary osteoarthritis, left shoulder: Secondary | ICD-10-CM

## 2018-09-30 MED ORDER — CYCLOBENZAPRINE HCL 7.5 MG PO TABS: 7.5000 mg | ORAL_TABLET | Freq: Two times a day (BID) | ORAL | 0 refills | Status: DC | PRN

## 2018-09-30 MED ORDER — TRIAMCINOLONE ACETONIDE 40 MG/ML IJ SUSP
40.0000 mg | Freq: Once | INTRAMUSCULAR | Status: AC
  Administered 2018-09-30: 40 mg via INTRA_ARTICULAR

## 2018-09-30 NOTE — Patient Instructions (Signed)
Good to see you Please use ice and tylenol  Please use the sling if it is hurting severe  Please try the range of motion exercises   Please send me a message in MyChart with any questions or updates.  Please see me back in 4 weeks.   --Dr. Raeford Razor

## 2018-09-30 NOTE — Progress Notes (Signed)
[name] - 53 y.o. female MRN [id number]  Date of birth: 1965/01/21  SUBJECTIVE:  Including CC & ROS.  No chief complaint on file.   [name] is a 53 y.o. female that is presenting with acute worsening of the left shoulder.  The pain got significantly worse and is severe in nature.  The pain is being more constant.  She is needing help to put her clothes on.  The pain is over the anterior proximal joint.  Has significant pain with any flexion or abduction.  Also seems to be radiating to the neck and down to the elbow.  Denies any numbness or tingling.  Denies any inciting event.  Did received a subacromial injection and had some minor improvement.   Review of Systems  Constitutional: Negative for fever.  HENT: Negative for congestion.   Respiratory: Negative for cough.   Cardiovascular: Negative for chest pain.  Gastrointestinal: Negative for abdominal pain.  Musculoskeletal: Positive for arthralgias.  Skin: Negative for color change.  Neurological: Negative for weakness.  Hematological: Negative for adenopathy.    HISTORY: Past Medical, Surgical, Social, and Family History Reviewed & Updated per EMR.   Pertinent Historical Findings include:  Past Medical History:  Diagnosis Date  . Allergic reaction   . Diabetes mellitus   . Hypertension     Past Surgical History:  Procedure Laterality Date  . THYROIDECTOMY    . TONSILLECTOMY    . TUBAL LIGATION  1990  . uterine ablation  2008    Allergies  Allergen Reactions  . Aspirin   . Codeine   . Lisinopril Swelling  . Penicillins     No family history on file.   Social History   Socioeconomic History  . Marital status: Married    Spouse name: Not on file  . Number of children: Not on file  . Years of education: Not on file  . Highest education level: Not on file  Occupational History  . Not on file  Social Needs  . Financial resource strain: Not on file  . Food insecurity    Worry: Not on file   Inability: Not on file  . Transportation needs    Medical: Not on file    Non-medical: Not on file  Tobacco Use  . Smoking status: Never Smoker  . Smokeless tobacco: Never Used  Substance and Sexual Activity  . Alcohol use: No  . Drug use: No  . Sexual activity: Not on file  Lifestyle  . Physical activity    Days per week: Not on file    Minutes per session: Not on file  . Stress: Not on file  Relationships  . Social Herbalist on phone: Not on file    Gets together: Not on file    Attends religious service: Not on file    Active member of club or organization: Not on file    Attends meetings of clubs or organizations: Not on file    Relationship status: Not on file  . Intimate partner violence    Fear of current or ex partner: Not on file    Emotionally abused: Not on file    Physically abused: Not on file    Forced sexual activity: Not on file  Other Topics Concern  . Not on file  Social History Narrative  . Not on file     PHYSICAL EXAM:  VS: There were no vitals taken for this visit. Physical Exam Gen: NAD,  alert, cooperative with exam, well-appearing ENT: normal lips, normal nasal mucosa,  Eye: normal EOM, normal conjunctiva and lids CV:  no edema, +2 pedal pulses   Resp: no accessory muscle use, non-labored,  Skin: no rashes, no areas of induration  Neuro: normal tone, normal sensation to touch Psych:  normal insight, alert and oriented MSK:  Left shoulder: Normal passive flexion and abduction. Normal external rotation passively. Pain with external rotation abduction. Pain with empty can testing. Neurovascular intact   Aspiration/Injection Procedure Note [name] [birth year]  Procedure: Injection Indications: Left shoulder pain  Procedure Details Consent: Risks of procedure as well as the alternatives and risks of each were explained to the (patient/caregiver).  Consent for procedure obtained. Time Out: Verified patient  identification, verified procedure, site/side was marked, verified correct patient position, special equipment/implants available, medications/allergies/relevent history reviewed, required imaging and test results available.  Performed.  The area was cleaned with iodine and alcohol swabs.    The left glenohumeral joint was injected using 1 cc's of 40 mg Kenalog and 4 cc's of 0.25% bupivacaine with a 22 3 1/2" needle.  Ultrasound was used. Images were obtained in trans views showing the injection.     A sterile dressing was applied.  Patient did tolerate procedure well.       ASSESSMENT & PLAN:   OA (osteoarthritis) of shoulder Pain seems more associated with the joint itself.  Has significant pain with any movement.  Did have effusion noted on previous ultrasound. -Glenohumeral injection. -Provided sling and counseled on its use. -Counseled on home exercise therapy and supportive care. -Could consider MRI or physical therapy.

## 2018-09-30 NOTE — Assessment & Plan Note (Signed)
Pain seems more associated with the joint itself.  Has significant pain with any movement.  Did have effusion noted on previous ultrasound. -Glenohumeral injection. -Provided sling and counseled on its use. -Counseled on home exercise therapy and supportive care. -Could consider MRI or physical therapy.

## 2018-10-02 ENCOUNTER — Telehealth: Payer: Self-pay | Admitting: Family Medicine

## 2018-10-02 MED ORDER — METHOCARBAMOL 500 MG PO TABS: 500.0000 mg | ORAL_TABLET | Freq: Two times a day (BID) | ORAL | 0 refills | Status: DC | PRN

## 2018-10-02 NOTE — Telephone Encounter (Signed)
Patient states her insurance will not cover Cyclobenzaprine Rx. Patient is asking if another muscle relaxant can be prescribed.   Pharmacy: [facility] on [address]

## 2018-10-02 NOTE — Telephone Encounter (Signed)
Sent in [place].   Rosemarie Ax, MD [facility] 10/02/2018, 10:12 AM

## 2018-10-03 ENCOUNTER — Other Ambulatory Visit: Payer: Self-pay | Admitting: Family Medicine

## 2018-10-03 MED ORDER — METHOCARBAMOL 500 MG PO TABS: 500.0000 mg | ORAL_TABLET | Freq: Two times a day (BID) | ORAL | 0 refills | Status: DC | PRN

## 2018-10-09 ENCOUNTER — Ambulatory Visit: Payer: BC Managed Care – PPO | Admitting: Family Medicine

## 2018-10-29 ENCOUNTER — Ambulatory Visit: Payer: BC Managed Care – PPO | Admitting: Family Medicine

## 2018-10-29 NOTE — Progress Notes (Deleted)
  AZAYLIA FONG - 53 y.o. female MRN [id number]  Date of birth: Jan 13, 1965  SUBJECTIVE:  Including CC & ROS.  No chief complaint on file.   Lavone CARMELL ELGIN is a 53 y.o. female that is  ***.  ***   Review of Systems  HISTORY: Past Medical, Surgical, Social, and Family History Reviewed & Updated per EMR.   Pertinent Historical Findings include:  Past Medical History:  Diagnosis Date  . Allergic reaction   . Diabetes mellitus   . Hypertension     Past Surgical History:  Procedure Laterality Date  . THYROIDECTOMY    . TONSILLECTOMY    . TUBAL LIGATION  1990  . uterine ablation  2008    Allergies  Allergen Reactions  . Aspirin   . Codeine   . Lisinopril Swelling  . Penicillins     No family history on file.   Social History   Socioeconomic History  . Marital status: Married    Spouse name: Not on file  . Number of children: Not on file  . Years of education: Not on file  . Highest education level: Not on file  Occupational History  . Not on file  Social Needs  . Financial resource strain: Not on file  . Food insecurity    Worry: Not on file    Inability: Not on file  . Transportation needs    Medical: Not on file    Non-medical: Not on file  Tobacco Use  . Smoking status: Never Smoker  . Smokeless tobacco: Never Used  Substance and Sexual Activity  . Alcohol use: No  . Drug use: No  . Sexual activity: Not on file  Lifestyle  . Physical activity    Days per week: Not on file    Minutes per session: Not on file  . Stress: Not on file  Relationships  . Social Herbalist on phone: Not on file    Gets together: Not on file    Attends religious service: Not on file    Active member of club or organization: Not on file    Attends meetings of clubs or organizations: Not on file    Relationship status: Not on file  . Intimate partner violence    Fear of current or ex partner: Not on file    Emotionally abused: Not on file    Physically  abused: Not on file    Forced sexual activity: Not on file  Other Topics Concern  . Not on file  Social History Narrative  . Not on file     PHYSICAL EXAM:  VS: There were no vitals taken for this visit. Physical Exam Gen: NAD, alert, cooperative with exam, well-appearing ENT: normal lips, normal nasal mucosa,  Eye: normal EOM, normal conjunctiva and lids CV:  no edema, +2 pedal pulses   Resp: no accessory muscle use, non-labored,  GI: no masses or tenderness, no hernia  Skin: no rashes, no areas of induration  Neuro: normal tone, normal sensation to touch Psych:  normal insight, alert and oriented MSK:  ***      ASSESSMENT & PLAN:   No problem-specific Assessment & Plan notes found for this encounter.

## 2019-01-10 ENCOUNTER — Telehealth (HOSPITAL_BASED_OUTPATIENT_CLINIC_OR_DEPARTMENT_OTHER): Payer: Self-pay | Admitting: Emergency Medicine

## 2019-01-10 ENCOUNTER — Other Ambulatory Visit: Payer: Self-pay

## 2019-01-10 ENCOUNTER — Encounter (HOSPITAL_BASED_OUTPATIENT_CLINIC_OR_DEPARTMENT_OTHER): Payer: Self-pay | Admitting: Emergency Medicine

## 2019-01-10 ENCOUNTER — Emergency Department (HOSPITAL_BASED_OUTPATIENT_CLINIC_OR_DEPARTMENT_OTHER)
Admission: EM | Admit: 2019-01-10 | Discharge: 2019-01-10 | Disposition: A | Discharge status: Home / Self Care | Payer: BC Managed Care – PPO | Attending: Emergency Medicine | Admitting: Emergency Medicine

## 2019-01-10 ENCOUNTER — Emergency Department (HOSPITAL_BASED_OUTPATIENT_CLINIC_OR_DEPARTMENT_OTHER): Payer: BC Managed Care – PPO

## 2019-01-10 DIAGNOSIS — E119 Type 2 diabetes mellitus without complications: Secondary | ICD-10-CM | POA: Insufficient documentation

## 2019-01-10 DIAGNOSIS — G8929 Other chronic pain: Secondary | ICD-10-CM | POA: Diagnosis not present

## 2019-01-10 DIAGNOSIS — I10 Essential (primary) hypertension: Secondary | ICD-10-CM | POA: Insufficient documentation

## 2019-01-10 DIAGNOSIS — M25512 Pain in left shoulder: Secondary | ICD-10-CM

## 2019-01-10 DIAGNOSIS — J45909 Unspecified asthma, uncomplicated: Secondary | ICD-10-CM | POA: Insufficient documentation

## 2019-01-10 DIAGNOSIS — Z79899 Other long term (current) drug therapy: Secondary | ICD-10-CM | POA: Insufficient documentation

## 2019-01-10 MED ORDER — DIAZEPAM 5 MG PO TABS
5.0000 mg | ORAL_TABLET | Freq: Once | ORAL | Status: AC
  Administered 2019-01-10: 5 mg via ORAL
  Filled 2019-01-10: qty 1

## 2019-01-10 MED ORDER — CYCLOBENZAPRINE HCL 10 MG PO TABS: 10.0000 mg | ORAL_TABLET | Freq: Three times a day (TID) | ORAL | 0 refills | Status: DC | PRN

## 2019-01-10 MED ORDER — MELOXICAM 10 MG PO CAPS: 10.0000 mg | ORAL_CAPSULE | Freq: Every day | ORAL | 0 refills | Status: DC | PRN

## 2019-01-10 MED ORDER — HYDROCODONE-ACETAMINOPHEN 5-325 MG PO TABS
1.0000 | ORAL_TABLET | Freq: Once | ORAL | Status: AC
  Administered 2019-01-10: 1 via ORAL
  Filled 2019-01-10: qty 1

## 2019-01-10 MED ORDER — LIDOCAINE 5 % EX PTCH
1.0000 | MEDICATED_PATCH | CUTANEOUS | Status: DC
  Filled 2019-01-10: qty 1

## 2019-01-10 MED ORDER — KETOROLAC TROMETHAMINE 60 MG/2ML IM SOLN
60.0000 mg | Freq: Once | INTRAMUSCULAR | Status: AC
  Administered 2019-01-10: 10:00:00 60 mg via INTRAMUSCULAR
  Filled 2019-01-10: qty 2

## 2019-01-10 MED ORDER — LIDOCAINE 5 % EX PTCH: 1.0000 | MEDICATED_PATCH | CUTANEOUS | 0 refills | Status: DC

## 2019-01-10 NOTE — ED Triage Notes (Signed)
Pt here with left shoulder and arm pain. States 10/10 throbbing pain. Under care of sports med upstairs, but could not wait for follow-up appt as pain has gotten too bad to sleep. Taken muscle relaxer, meloxicam, alieve, tylenol without relief.

## 2019-01-10 NOTE — Discharge Instructions (Addendum)
You are seen in the emergency department today for left shoulder and forearm pain.  Your x-rays were normal.  We are sending you home with the following medicines to help with your symptoms: -meloxicam: is a nonsteroidal anti-inflammatory medication that will help with pain and swelling. Be sure to take this medication as prescribed with food, 1 pill every 24 hours,  It should be taken with food, as it can cause stomach upset, and more seriously, stomach bleeding. Do not take other nonsteroidal anti-inflammatory medications with this such as Advil, Motrin, Aleve, Mobic, Goodie Powder, or Motrin.  Do not start taking this medication until tomorrow as we gave you a similar medicine here in the emergency department.  - Flexeril is the muscle relaxer I have prescribed, this is meant to help with muscle tightness. Be aware that this medication may make you drowsy therefore the first time you take this it should be at a time you are in an environment where you can rest. Do not drive or operate heavy machinery when taking this medication. Do not drink alcohol or take other sedating medications with this medicine such as narcotics or benzodiazepines.  Do not take methocarbamol (Robaxin) with this medicine as they have similar functions. -Lidoderm patches: These are topical patches to place directly over your area of no significant pain to help numb/to the area.  Apply once per day.  You make take Tylenol per over the counter dosing with these medications.   We have prescribed you new medication(s) today. Discuss the medications prescribed today with your pharmacist as they can have adverse effects and interactions with your other medicines including over the counter and prescribed medications. Seek medical evaluation if you start to experience new or abnormal symptoms after taking one of these medicines, seek care immediately if you start to experience difficulty breathing, feeling of your throat closing, facial  swelling, or rash as these could be indications of a more serious allergic reaction   Please follow-up with Dr. Jordan Likes office on Monday for reevaluation.  Return to the emergency department for new or worsening symptoms or any other concerns.

## 2019-01-10 NOTE — ED Provider Notes (Signed)
[facility] EMERGENCY DEPARTMENT Provider Note   CSN: [id number] Arrival date & time: 01/10/19  0109     History Chief Complaint  Patient presents with  . Shoulder Pain    [name] is a 54 y.o. female with a hx of DM, asthma & left shoulder osteoarthritis who presents to the ED with complaints of acute on chronic L shoulder pain that worsened 3 days prior. Patient states pain is located in the L shoulder, but also in the L dorsal forearm. Discomfort is constant, aching, worse with movement, no alleviating factors. Reports intermittent paresthesias to all fingers and that the LUE is not as strong as the RUE- each of these issues are not new. Has tried meloxicam, advil, robaxin, & tylenol @ home as well as ice without much relief. She has had similar pain prompting ED visits and sports medicine visits. Has had a subacromial injection with improvement, subsequently required glenohumeral joint injection, consideration of MRI/PT @ that time during office note from 09/2018. She denies trauma/change in activity. Denies fever, chills, numbness, incontinence, dyspnea, or chest pain. Denies IVDU or hx of cancer.   HPI     Past Medical History:  Diagnosis Date  . Allergic reaction   . Diabetes mellitus   . Hypertension     Patient Active Problem List   Diagnosis Date Noted  . OA (osteoarthritis) of shoulder 07/30/2018  . ASTHMA 10/28/2006  . HYPERSOMNIA 10/28/2006    Past Surgical History:  Procedure Laterality Date  . THYROIDECTOMY    . TONSILLECTOMY    . TUBAL LIGATION  1990  . uterine ablation  2008     OB History   No obstetric history on file.     History reviewed. No pertinent family history.  Social History   Tobacco Use  . Smoking status: Never Smoker  . Smokeless tobacco: Never Used  Substance Use Topics  . Alcohol use: No  . Drug use: No    Home Medications Prior to Admission medications   Medication Sig Start Date End Date Taking?  Authorizing Provider  albuterol (PROVENTIL HFA;VENTOLIN HFA) 108 (90 Base) MCG/ACT inhaler Inhale 4 puffs into the lungs every 4 (four) hours as needed for wheezing or shortness of breath. 02/04/16   Mesner, Barbara Cower, MD  atorvastatin (LIPITOR) 10 MG tablet Take 10 mg by mouth daily.    [provider]  azithromycin (ZITHROMAX) 250 MG tablet Take 1 tablet (250 mg total) by mouth daily. Take 1 every day until finished. 02/04/16   Mesner, Barbara Cower, MD  cyclobenzaprine (FEXMID) 7.5 MG tablet Take 1 tablet (7.5 mg total) by mouth 2 (two) times daily as needed for muscle spasms. 09/30/18   Myra Rude, MD  Dulaglutide (TRULICITY [place]) Inject into the skin.    [provider]  empagliflozin (JARDIANCE) 10 MG TABS tablet Take 10 mg by mouth daily.    [provider]  HYDROcodone-acetaminophen (NORCO/VICODIN) 5-325 MG tablet Take 1-2 tablets by mouth every 4 (four) hours as needed. 07/28/18   Rolan Bucco, MD  levothyroxine (SYNTHROID, LEVOTHROID) 100 MCG tablet Take 100 mcg by mouth daily.      [provider]  losartan (COZAAR) 50 MG tablet Take 50 mg by mouth daily.    [provider]  metFORMIN (GLUMETZA) 1000 MG (MOD) 24 hr tablet Take 1,000 mg by mouth daily with breakfast.      [provider]  methocarbamol (ROBAXIN) 500 MG tablet Take 1 tablet (500 mg total) by mouth  2 (two) times daily as needed for muscle spasms. 10/03/18   Myra Rude, MD  nitroGLYCERIN (NITRODUR - DOSED IN MG/24 HR) 0.2 mg/hr patch Cut and apply 1/4 patch to most painful area q24h. 08/26/18   Myra Rude, MD  nortriptyline (PAMELOR) 75 MG capsule Take 75 mg by mouth 3 (three) times daily.    [provider]  predniSONE (DELTASONE) 20 MG tablet 3 tabs po day one, then 2 po daily x 4 days 07/28/18   Rolan Bucco, MD  pregabalin (LYRICA) 100 MG capsule Take 100 mg by mouth 2 (two) times daily.    [provider]  simvastatin (ZOCOR) 10 MG tablet Take 10  mg by mouth at bedtime.    12/09/12  [provider]  sitaGLIPtin (JANUVIA) 25 MG tablet Take 25 mg by mouth daily.    12/09/12  [provider]    Allergies    Aspirin, Codeine, Lisinopril, and Penicillins  Review of Systems   Review of Systems  Constitutional: Negative for chills and fever.  Respiratory: Negative for shortness of breath.   Cardiovascular: Negative for chest pain.  Genitourinary: Negative for dysuria and hematuria.  Musculoskeletal: Positive for arthralgias and myalgias.  Skin: Negative for color change and rash.  Neurological: Positive for weakness (LUE compared to RUE- not acute). Negative for numbness.       Positive for LUE finger paresthesias- non acute. Negative for incontinence or saddle anesthesia.     Physical Exam Updated Vital Signs BP (!) 148/85 (BP Location: Right Arm)   Pulse 72   Temp 98.8 F (37.1 C) (Oral)   Resp 14   Ht 5\' 4"  (1.626 m)   Wt 66.7 kg   SpO2 100%   BMI 25.23 kg/m   Physical Exam Vitals and nursing note reviewed.  Constitutional:      General: She is not in acute distress.    Appearance: Normal appearance. She is not ill-appearing or toxic-appearing.  HENT:     Head: Normocephalic and atraumatic.  Neck:     Comments: No midline tenderness.  Cardiovascular:     Rate and Rhythm: Normal rate.     Pulses:          Radial pulses are 2+ on the right side and 2+ on the left side.  Pulmonary:     Effort: No respiratory distress.     Breath sounds: Normal breath sounds.  Musculoskeletal:     Cervical back: Normal range of motion and neck supple. Muscular tenderness (left) present.     Comments: Upper extremities: No obvious deformity, appreciable swelling, edema, erythema, ecchymosis, warmth, or open wounds. Patient has intact AROM throughout with exception of mild limitation in R shoulder flexion/abduction- able to move past 90 degrees, has pain with this. Tender to palpation to the diffuse left glenohumeral  joint- more so anteriorly & laterally. Tender to palpation to the dorsal proximal 2/3rds of the forearm as well.   Skin:    General: Skin is warm and dry.     Capillary Refill: Capillary refill takes less than 2 seconds.  Neurological:     Mental Status: She is alert.     Comments: Alert. Clear speech. Sensation grossly intact to bilateral upper extremities. Good strength with shoulder flexion/extension, elbow flexion/extension, wrist flexion/extension, and grip strength bilaterally. Ambulatory.   Psychiatric:        Mood and Affect: Mood normal.        Behavior: Behavior normal.     ED  Results / Procedures / Treatments   Labs (all labs ordered are listed, but only abnormal results are displayed) Labs Reviewed - No data to display  EKG None  Radiology No results found.  Procedures Procedures (including critical care time)  Medications Ordered in ED Medications - No data to display  ED Course  I have reviewed the triage vital signs and the nursing notes.  Pertinent labs & imaging results that were available during my care of the patient were reviewed by me and considered in my medical decision making (see chart for details).    MDM Rules/Calculators/A&P                      Patient presents with complaints of acute on chronic L shoulder pain as well as L forearm pain. She mentions some paresthesias to the digits intermittently and that the LUE does not feel as strong as the RUE but these are not acute complaints- she has no neuro deficits or midline spinal tenderness on exam, no recent trauma- do not suspect cervical spine fracture/subluxation or cord compression. She has no fever, erythema, warmth, or rashes, no hx of IVDU- do not suspect epidural abscess, septic joint, or shingles. Xrays without significant acute abnormality. No edema, does not seem consistent w/ DVT. NVI distally. Potentially related to her previously diagnosed RA, seems to have a muscular component as well,  given medications in the ED with mild improvement. Will discharge home with alternative flexeril as muscle relaxant and trial of lidoderm patches, she also is requesting a few more tablets of meloxicam which will be prescribed. Encouraged patient to follow up with sports medicine on Monday. I discussed results, treatment plan, need for follow-up, and return precautions with the patient. Provided opportunity for questions, patient confirmed understanding and is in agreement with plan.    Final Clinical Impression(s) / ED Diagnoses Final diagnoses:  Left shoulder pain, unspecified chronicity    Rx / DC Orders ED Discharge Orders         Ordered    lidocaine (LIDODERM) 5 %  Every 24 hours     01/10/19 1101    cyclobenzaprine (FLEXERIL) 10 MG tablet  3 times daily PRN     01/10/19 1101    Meloxicam 10 MG CAPS  Daily PRN     01/10/19 1101           Chessie Neuharth, [place] R, PA-C 01/10/19 1104    Virgel Manifold, MD 01/10/19 1229

## 2019-01-12 ENCOUNTER — Ambulatory Visit: Payer: BC Managed Care – PPO | Admitting: Family Medicine

## 2019-01-12 ENCOUNTER — Encounter: Payer: Self-pay | Admitting: Family Medicine

## 2019-01-12 ENCOUNTER — Other Ambulatory Visit: Payer: Self-pay

## 2019-01-12 VITALS — BP 147/95 | HR 99 | Ht 64.0 in | Wt 147.0 lb

## 2019-01-12 DIAGNOSIS — M19012 Primary osteoarthritis, left shoulder: Secondary | ICD-10-CM

## 2019-01-12 MED ORDER — PREDNISONE 5 MG PO TABS: ORAL_TABLET | ORAL | 0 refills | Status: DC

## 2019-01-12 NOTE — Patient Instructions (Signed)
Good to see you Please try the prednisone  Please try ice  Happy early Birthday!  Please call [facility] to schedule the MRi  Please send me a message in MyChart with any questions or updates.  We will schedule a virtual visit once the MRI is resulted.   --Dr. Jordan Likes

## 2019-01-12 NOTE — Progress Notes (Signed)
[name] - 54 y.o. female MRN [id number]  Date of birth: 15-Aug-1965  SUBJECTIVE:  Including CC & ROS.  Chief Complaint  Patient presents with  . Shoulder Pain    left shoulder    [name] is a 54 y.o. female that is presenting with acute worsening of the left shoulder pain.  The pain is worse when she lies on the affected side.  It is sharp and throbbing.  It seems to be worse at night.  She has pain with certain movements.  Unable to reach in certain positions without significant pain.  Have tried subacromial injection as well as joint injection with limited improvement.  Pain recently got worse.   Review of Systems See HPI  HISTORY: Past Medical, Surgical, Social, and Family History Reviewed & Updated per EMR.   Pertinent Historical Findings include:  Past Medical History:  Diagnosis Date  . Allergic reaction   . Diabetes mellitus   . Hypertension     Past Surgical History:  Procedure Laterality Date  . THYROIDECTOMY    . TONSILLECTOMY    . TUBAL LIGATION  1990  . uterine ablation  2008    Allergies  Allergen Reactions  . Aspirin   . Codeine   . Lisinopril Swelling  . Penicillins     No family history on file.   Social History   Socioeconomic History  . Marital status: Married    Spouse name: Not on file  . Number of children: Not on file  . Years of education: Not on file  . Highest education level: Not on file  Occupational History  . Not on file  Tobacco Use  . Smoking status: Never Smoker  . Smokeless tobacco: Never Used  Substance and Sexual Activity  . Alcohol use: No  . Drug use: No  . Sexual activity: Not on file  Other Topics Concern  . Not on file  Social History Narrative  . Not on file   Social Determinants of Health   Financial Resource Strain:   . Difficulty of Paying Living Expenses: Not on file  Food Insecurity:   . Worried About Charity fundraiser in the Last Year: Not on file  . Ran Out of Food in the Last  Year: Not on file  Transportation Needs:   . Lack of Transportation (Medical): Not on file  . Lack of Transportation (Non-Medical): Not on file  Physical Activity:   . Days of Exercise per Week: Not on file  . Minutes of Exercise per Session: Not on file  Stress:   . Feeling of Stress : Not on file  Social Connections:   . Frequency of Communication with Friends and Family: Not on file  . Frequency of Social Gatherings with Friends and Family: Not on file  . Attends Religious Services: Not on file  . Active Member of Clubs or Organizations: Not on file  . Attends Archivist Meetings: Not on file  . Marital Status: Not on file  Intimate Partner Violence:   . Fear of Current or Ex-Partner: Not on file  . Emotionally Abused: Not on file  . Physically Abused: Not on file  . Sexually Abused: Not on file     PHYSICAL EXAM:  VS: BP (!) 147/95   Pulse 99   Ht 5\' 4"  (1.626 m)   Wt 147 lb (66.7 kg)   BMI 25.23 kg/m  Physical Exam Gen: NAD, alert, cooperative with exam, well-appearing ENT: normal lips,  normal nasal mucosa,  Eye: normal EOM, normal conjunctiva and lids CV:  no edema, +2 pedal pulses   Resp: no accessory muscle use, non-labored,  Skin: no rashes, no areas of induration  Neuro: normal tone, normal sensation to touch Psych:  normal insight, alert and oriented MSK:  Left shoulder: Stiffness and pain with external rotation. Pain with external rotation abduction. Normal flexion. Normal strength resistance with internal and external rotation. No significant pain with empty can testing. Neurovascularly intact     ASSESSMENT & PLAN:   OA (osteoarthritis) of shoulder Acutely worsening of pain.  Has tried different injections with limited improvement.  Pain is gotten worse as of late.  Concern for worsening degenerative changes are capsulitis. -Prednisone. -Counseled on home exercise therapy and supportive care -MRI to evaluate for capsulitis versus  significant degenerative changes

## 2019-01-12 NOTE — Assessment & Plan Note (Signed)
Acutely worsening of pain.  Has tried different injections with limited improvement.  Pain is gotten worse as of late.  Concern for worsening degenerative changes are capsulitis. -Prednisone. -Counseled on home exercise therapy and supportive care -MRI to evaluate for capsulitis versus significant degenerative changes

## 2019-02-02 ENCOUNTER — Telehealth: Payer: Self-pay | Admitting: Family Medicine

## 2019-02-02 MED ORDER — DIAZEPAM 5 MG PO TABS: ORAL_TABLET | ORAL | 0 refills | Status: DC

## 2019-02-02 NOTE — Telephone Encounter (Signed)
Provided valium for MRI.   Myra Rude, MD [facility] 02/02/2019, 4:59 PM

## 2019-02-02 NOTE — Telephone Encounter (Signed)
Patient has her MRI scheduled for Saturday January 30th. Patient is claustrophobic and requesting medication.   [facility] [address] [facility] [place] [address] in [place]

## 2019-02-03 ENCOUNTER — Other Ambulatory Visit: Payer: Self-pay | Admitting: Family Medicine

## 2019-02-03 MED ORDER — DIAZEPAM 5 MG PO TABS: ORAL_TABLET | ORAL | 0 refills | Status: DC

## 2019-02-04 ENCOUNTER — Other Ambulatory Visit: Payer: Self-pay | Admitting: *Deleted

## 2019-02-04 ENCOUNTER — Other Ambulatory Visit: Payer: Self-pay | Admitting: Family Medicine

## 2019-02-04 MED ORDER — FLUCONAZOLE 150 MG PO TABS: 150.0000 mg | ORAL_TABLET | Freq: Once | ORAL | 0 refills | Status: DC

## 2019-02-04 MED ORDER — FLUCONAZOLE 150 MG PO TABS: 150.0000 mg | ORAL_TABLET | Freq: Once | ORAL | 0 refills | Status: AC

## 2019-02-07 ENCOUNTER — Ambulatory Visit
Admission: RE | Admit: 2019-02-07 | Discharge: 2019-02-07 | Disposition: A | Discharge status: Home / Self Care | Payer: BC Managed Care – PPO | Source: Ambulatory Visit | Attending: Family Medicine | Admitting: Family Medicine

## 2019-02-07 ENCOUNTER — Other Ambulatory Visit: Payer: Self-pay

## 2019-02-07 DIAGNOSIS — M19012 Primary osteoarthritis, left shoulder: Secondary | ICD-10-CM

## 2019-02-09 ENCOUNTER — Telehealth (INDEPENDENT_AMBULATORY_CARE_PROVIDER_SITE_OTHER): Payer: BC Managed Care – PPO | Admitting: Family Medicine

## 2019-02-09 ENCOUNTER — Other Ambulatory Visit: Payer: Self-pay

## 2019-02-09 DIAGNOSIS — M67922 Unspecified disorder of synovium and tendon, left upper arm: Secondary | ICD-10-CM | POA: Diagnosis not present

## 2019-02-09 DIAGNOSIS — M67929 Unspecified disorder of synovium and tendon, unspecified upper arm: Secondary | ICD-10-CM | POA: Insufficient documentation

## 2019-02-09 NOTE — Progress Notes (Signed)
Virtual Visit via Video Note  I connected with [name] on 02/09/19 at  1:30 PM EST by a video enabled telemedicine application and verified that I am speaking with the correct person using two identifiers.   I discussed the limitations of evaluation and management by telemedicine and the availability of in person appointments. The patient expressed understanding and agreed to proceed.  History of Present Illness:  [name] is a 54 year old female is following up for her left shoulder pain.  MRI was revealing for different pathologies.  It was revealing for biceps tendinosis with high-grade partial tearing and moderate rotator cuff tendinosis with low-grade partial thickness tear.  Her pain is ongoing despite different injections and nitro patches.   Observations/Objective:  Gen: NAD, alert, cooperative with exam, well-appearing   Assessment and Plan:  Biceps tendinopathy: Having changes of the biceps tendon which is likely causing some of the pain.   -Counseled supportive care and home exercise therapy. -Can try a biceps tendon injection.  Follow Up Instructions:    I discussed the assessment and treatment plan with the patient. The patient was provided an opportunity to ask questions and all were answered. The patient agreed with the plan and demonstrated an understanding of the instructions.   The patient was advised to call back or seek an in-person evaluation if the symptoms worsen or if the condition fails to improve as anticipated.   Clare Gandy, MD

## 2019-02-09 NOTE — Assessment & Plan Note (Signed)
Having changes of the biceps tendon which is likely causing some of the pain.   -Counseled supportive care and home exercise therapy. -Can try a biceps tendon injection.

## 2019-02-11 ENCOUNTER — Other Ambulatory Visit: Payer: Self-pay

## 2019-02-11 ENCOUNTER — Ambulatory Visit (INDEPENDENT_AMBULATORY_CARE_PROVIDER_SITE_OTHER): Payer: BC Managed Care – PPO | Admitting: Family Medicine

## 2019-02-11 ENCOUNTER — Ambulatory Visit: Payer: Self-pay

## 2019-02-11 DIAGNOSIS — M67922 Unspecified disorder of synovium and tendon, left upper arm: Secondary | ICD-10-CM

## 2019-02-11 MED ORDER — METHYLPREDNISOLONE ACETATE 40 MG/ML IJ SUSP
40.0000 mg | Freq: Once | INTRAMUSCULAR | Status: AC
  Administered 2019-02-11: 17:00:00 40 mg via INTRA_ARTICULAR

## 2019-02-11 NOTE — Assessment & Plan Note (Signed)
She has tried a subacromial as well as a glenohumeral injection with limited improvement.  MRI was revealing for a partial tearing as well as significant tendinosis. -Injection. -Counseled on home exercise therapy and supportive care. -May need to consider surgery if no improvement.

## 2019-02-11 NOTE — Progress Notes (Signed)
[name] - 54 y.o. female MRN [id number]  Date of birth: 1965/11/29  SUBJECTIVE:  Including CC & ROS.  No chief complaint on file.   [name] is a 54 y.o. female that is presenting with ongoing left shoulder pain.  She is here for a biceps tendon injection   Review of Systems See HPI   HISTORY: Past Medical, Surgical, Social, and Family History Reviewed & Updated per EMR.   Pertinent Historical Findings include:  Past Medical History:  Diagnosis Date  . Allergic reaction   . Diabetes mellitus   . Hypertension     Past Surgical History:  Procedure Laterality Date  . THYROIDECTOMY    . TONSILLECTOMY    . TUBAL LIGATION  1990  . uterine ablation  2008    Allergies  Allergen Reactions  . Aspirin   . Codeine   . Lisinopril Swelling  . Penicillins     No family history on file.   Social History   Socioeconomic History  . Marital status: Married    Spouse name: Not on file  . Number of children: Not on file  . Years of education: Not on file  . Highest education level: Not on file  Occupational History  . Not on file  Tobacco Use  . Smoking status: Never Smoker  . Smokeless tobacco: Never Used  Substance and Sexual Activity  . Alcohol use: No  . Drug use: No  . Sexual activity: Not on file  Other Topics Concern  . Not on file  Social History Narrative  . Not on file   Social Determinants of Health   Financial Resource Strain:   . Difficulty of Paying Living Expenses: Not on file  Food Insecurity:   . Worried About Charity fundraiser in the Last Year: Not on file  . Ran Out of Food in the Last Year: Not on file  Transportation Needs:   . Lack of Transportation (Medical): Not on file  . Lack of Transportation (Non-Medical): Not on file  Physical Activity:   . Days of Exercise per Week: Not on file  . Minutes of Exercise per Session: Not on file  Stress:   . Feeling of Stress : Not on file  Social Connections:   . Frequency of  Communication with Friends and Family: Not on file  . Frequency of Social Gatherings with Friends and Family: Not on file  . Attends Religious Services: Not on file  . Active Member of Clubs or Organizations: Not on file  . Attends Archivist Meetings: Not on file  . Marital Status: Not on file  Intimate Partner Violence:   . Fear of Current or Ex-Partner: Not on file  . Emotionally Abused: Not on file  . Physically Abused: Not on file  . Sexually Abused: Not on file     PHYSICAL EXAM:  VS: There were no vitals taken for this visit. Physical Exam Gen: NAD, alert, cooperative with exam, well-appearing   Aspiration/Injection Procedure Note [name] Sep 23, 1965  Procedure: Injection Indications: left shoulder pain   Procedure Details Consent: Risks of procedure as well as the alternatives and risks of each were explained to the (patient/caregiver).  Consent for procedure obtained. Time Out: Verified patient identification, verified procedure, site/side was marked, verified correct patient position, special equipment/implants available, medications/allergies/relevent history reviewed, required imaging and test results available.  Performed.  The area was cleaned with iodine and alcohol swabs.    The left biceps  tendon was injected using 1 cc's of 40 mg Depo-Medrol and 2 cc's of 0.25% bupivacaine with a 25 1 1/2" needle.  Ultrasound was used. Images were obtained in long views showing the injection.     A sterile dressing was applied.  Patient did tolerate procedure well.      ASSESSMENT & PLAN:   Biceps tendinopathy She has tried a subacromial as well as a glenohumeral injection with limited improvement.  MRI was revealing for a partial tearing as well as significant tendinosis. -Injection. -Counseled on home exercise therapy and supportive care. -May need to consider surgery if no improvement.

## 2019-02-11 NOTE — Patient Instructions (Signed)
Good to see you Please try ice  Please try the exercises   Please send me a message in MyChart with any questions or updates.  Please see me back in 6-8 weeks.   --Dr. Leora Platt  

## 2019-03-25 ENCOUNTER — Ambulatory Visit: Payer: BC Managed Care – PPO | Admitting: Family Medicine

## 2019-03-25 NOTE — Progress Notes (Deleted)
  [name] - 54 y.o. female MRN [id number]  Date of birth: 1965-07-10  SUBJECTIVE:  Including CC & ROS.  No chief complaint on file.   [name] is a 54 y.o. female that is  ***.  ***   Review of Systems See HPI   HISTORY: Past Medical, Surgical, Social, and Family History Reviewed & Updated per EMR.   Pertinent Historical Findings include:  Past Medical History:  Diagnosis Date  . Allergic reaction   . Diabetes mellitus   . Hypertension     Past Surgical History:  Procedure Laterality Date  . THYROIDECTOMY    . TONSILLECTOMY    . TUBAL LIGATION  1990  . uterine ablation  2008    No family history on file.  Social History   Socioeconomic History  . Marital status: Married    Spouse name: Not on file  . Number of children: Not on file  . Years of education: Not on file  . Highest education level: Not on file  Occupational History  . Not on file  Tobacco Use  . Smoking status: Never Smoker  . Smokeless tobacco: Never Used  Substance and Sexual Activity  . Alcohol use: No  . Drug use: No  . Sexual activity: Not on file  Other Topics Concern  . Not on file  Social History Narrative  . Not on file   Social Determinants of Health   Financial Resource Strain:   . Difficulty of Paying Living Expenses:   Food Insecurity:   . Worried About Programme researcher, broadcasting/film/video in the Last Year:   . Barista in the Last Year:   Transportation Needs:   . Freight forwarder (Medical):   Marland Kitchen Lack of Transportation (Non-Medical):   Physical Activity:   . Days of Exercise per Week:   . Minutes of Exercise per Session:   Stress:   . Feeling of Stress :   Social Connections:   . Frequency of Communication with Friends and Family:   . Frequency of Social Gatherings with Friends and Family:   . Attends Religious Services:   . Active Member of Clubs or Organizations:   . Attends Banker Meetings:   Marland Kitchen Marital Status:   Intimate Partner Violence:    . Fear of Current or Ex-Partner:   . Emotionally Abused:   Marland Kitchen Physically Abused:   . Sexually Abused:      PHYSICAL EXAM:  VS: There were no vitals taken for this visit. Physical Exam Gen: NAD, alert, cooperative with exam, well-appearing MSK:  ***      ASSESSMENT & PLAN:   No problem-specific Assessment & Plan notes found for this encounter.

## 2019-04-08 ENCOUNTER — Ambulatory Visit: Payer: BC Managed Care – PPO | Admitting: Family Medicine

## 2019-04-08 ENCOUNTER — Other Ambulatory Visit: Payer: Self-pay

## 2019-04-08 DIAGNOSIS — M19012 Primary osteoarthritis, left shoulder: Secondary | ICD-10-CM | POA: Diagnosis not present

## 2019-04-08 MED ORDER — METHOCARBAMOL 500 MG PO TABS: 500.0000 mg | ORAL_TABLET | Freq: Two times a day (BID) | ORAL | 1 refills | Status: DC | PRN

## 2019-04-08 NOTE — Patient Instructions (Signed)
Good to see you  Please try heat before the exercises and ice after  Please try the pennsaid  Please try the do the exercises 2 or 3 times per week  Please send me a message in MyChart with any questions or updates.  Please see me back in 2-3 months.   --Dr. Jordan Likes

## 2019-04-08 NOTE — Progress Notes (Signed)
  [name] - 54 y.o. female MRN [id number]  Date of birth: 08-29-1965  SUBJECTIVE:  Including CC & ROS.  No chief complaint on file.   [name] is a 54 y.o. female that is following up for her left shoulder pain.  She is received a subacromial injection, glenohumeral injection and a biceps tendon sheath injection.  MRI was demonstrating different pathologies.  She seems to be well controlled with the exercises as well as meloxicam.  The muscle relaxer helps as well.  Her pain is minimal intermittent currently.   Review of Systems See HPI   HISTORY: Past Medical, Surgical, Social, and Family History Reviewed & Updated per EMR.   Pertinent Historical Findings include:  Past Medical History:  Diagnosis Date  . Allergic reaction   . Diabetes mellitus   . Hypertension     Past Surgical History:  Procedure Laterality Date  . THYROIDECTOMY    . TONSILLECTOMY    . TUBAL LIGATION  1990  . uterine ablation  2008    No family history on file.  Social History   Socioeconomic History  . Marital status: Married    Spouse name: Not on file  . Number of children: Not on file  . Years of education: Not on file  . Highest education level: Not on file  Occupational History  . Not on file  Tobacco Use  . Smoking status: Never Smoker  . Smokeless tobacco: Never Used  Substance and Sexual Activity  . Alcohol use: No  . Drug use: No  . Sexual activity: Not on file  Other Topics Concern  . Not on file  Social History Narrative  . Not on file   Social Determinants of Health   Financial Resource Strain:   . Difficulty of Paying Living Expenses:   Food Insecurity:   . Worried About Programme researcher, broadcasting/film/video in the Last Year:   . Barista in the Last Year:   Transportation Needs:   . Freight forwarder (Medical):   Marland Kitchen Lack of Transportation (Non-Medical):   Physical Activity:   . Days of Exercise per Week:   . Minutes of Exercise per Session:   Stress:   .  Feeling of Stress :   Social Connections:   . Frequency of Communication with Friends and Family:   . Frequency of Social Gatherings with Friends and Family:   . Attends Religious Services:   . Active Member of Clubs or Organizations:   . Attends Banker Meetings:   Marland Kitchen Marital Status:   Intimate Partner Violence:   . Fear of Current or Ex-Partner:   . Emotionally Abused:   Marland Kitchen Physically Abused:   . Sexually Abused:      PHYSICAL EXAM:  VS: BP 120/80   Ht 5\' 4"  (1.626 m)   Wt 157 lb (71.2 kg)   BMI 26.95 kg/m  Physical Exam Gen: NAD, alert, cooperative with exam, well-appearing MSK:  Left shoulder: Normal active flexion and abduction. Normal internal and external rotation. Normal strength resistance. Neurovascularly intact     ASSESSMENT & PLAN:   OA (osteoarthritis) of shoulder Has been doing well recently.  No significant pain.  -Refilled Robaxin. -Provided samples of Pennsaid. -Can take meloxicam as needed. -Counseled on home exercise therapy and supportive care. -Could consider repeat injection if exacerbation of pain.

## 2019-04-08 NOTE — Assessment & Plan Note (Signed)
Has been doing well recently.  No significant pain.  -Refilled Robaxin. -Provided samples of Pennsaid. -Can take meloxicam as needed. -Counseled on home exercise therapy and supportive care. -Could consider repeat injection if exacerbation of pain.

## 2019-05-04 ENCOUNTER — Telehealth: Payer: Self-pay | Admitting: Family Medicine

## 2019-05-04 NOTE — Telephone Encounter (Signed)
Left VM for patient. If she calls back please have her speak with a nurse/CMA and that we could try a course of prednisone to help with her pain. We could also try physical therapy or another steroid injection in her shoulder.   If any questions then please take the best time and phone number to call and I will try to call her back.   Myra Rude, MD [facility] 05/04/2019, 8:26 AM

## 2019-05-04 NOTE — Telephone Encounter (Signed)
Patient left a voicemail Friday afternoon asking for advice on what to do for pain. Stated pain is traveling up her neck and the side of her arm. She stated she has "tried everything" and has been putting ice and heat on the area

## 2019-05-05 ENCOUNTER — Ambulatory Visit (INDEPENDENT_AMBULATORY_CARE_PROVIDER_SITE_OTHER): Payer: BC Managed Care – PPO | Admitting: Family Medicine

## 2019-05-05 ENCOUNTER — Other Ambulatory Visit: Payer: Self-pay

## 2019-05-05 ENCOUNTER — Ambulatory Visit: Payer: Self-pay

## 2019-05-05 ENCOUNTER — Encounter: Payer: Self-pay | Admitting: Family Medicine

## 2019-05-05 VITALS — BP 123/78 | Ht 64.0 in | Wt 157.0 lb

## 2019-05-05 DIAGNOSIS — M67922 Unspecified disorder of synovium and tendon, left upper arm: Secondary | ICD-10-CM

## 2019-05-05 DIAGNOSIS — M67819 Other specified disorders of synovium and tendon, unspecified shoulder: Secondary | ICD-10-CM

## 2019-05-05 MED ORDER — METHYLPREDNISOLONE ACETATE 40 MG/ML IJ SUSP
40.0000 mg | Freq: Once | INTRAMUSCULAR | Status: AC
  Administered 2019-05-05: 16:00:00 40 mg via INTRA_ARTICULAR

## 2019-05-05 NOTE — Patient Instructions (Signed)
Good to see you Please use ice  Please let me know if you are still having pain and I can send something in.  [facility] will give you a call   Please send me a message in MyChart with any questions or updates.  Please see [place] back as needed.   --Dr. Jordan Likes

## 2019-05-05 NOTE — Progress Notes (Signed)
[name] - 54 y.o. female MRN [id number]  Date of birth: 1965/03/26  SUBJECTIVE:  Including CC & ROS.  Chief Complaint  Patient presents with  . Shoulder Pain    left shoulder    [name] is a 54 y.o. female that is presenting with acute worsening of the left shoulder.  She is received different injections in this area.  She is unsure of what exacerbated the pain.  It was more severe over the weekend and has improved somewhat.  The pain is over the lateral aspect.  It is worse with any movement and if she lies on the affected side.   Review of Systems See HPI   HISTORY: Past Medical, Surgical, Social, and Family History Reviewed & Updated per EMR.   Pertinent Historical Findings include:  Past Medical History:  Diagnosis Date  . Allergic reaction   . Diabetes mellitus   . Hypertension     Past Surgical History:  Procedure Laterality Date  . THYROIDECTOMY    . TONSILLECTOMY    . TUBAL LIGATION  1990  . uterine ablation  2008    No family history on file.  Social History   Socioeconomic History  . Marital status: Married    Spouse name: Not on file  . Number of children: Not on file  . Years of education: Not on file  . Highest education level: Not on file  Occupational History  . Not on file  Tobacco Use  . Smoking status: Never Smoker  . Smokeless tobacco: Never Used  Substance and Sexual Activity  . Alcohol use: No  . Drug use: No  . Sexual activity: Not on file  Other Topics Concern  . Not on file  Social History Narrative  . Not on file   Social Determinants of Health   Financial Resource Strain:   . Difficulty of Paying Living Expenses:   Food Insecurity:   . Worried About Charity fundraiser in the Last Year:   . Arboriculturist in the Last Year:   Transportation Needs:   . Film/video editor (Medical):   Marland Kitchen Lack of Transportation (Non-Medical):   Physical Activity:   . Days of Exercise per Week:   . Minutes of Exercise per  Session:   Stress:   . Feeling of Stress :   Social Connections:   . Frequency of Communication with Friends and Family:   . Frequency of Social Gatherings with Friends and Family:   . Attends Religious Services:   . Active Member of Clubs or Organizations:   . Attends Archivist Meetings:   Marland Kitchen Marital Status:   Intimate Partner Violence:   . Fear of Current or Ex-Partner:   . Emotionally Abused:   Marland Kitchen Physically Abused:   . Sexually Abused:      PHYSICAL EXAM:  VS: BP 123/78   Ht 5\' 4"  (1.626 m)   Wt 157 lb (71.2 kg)   BMI 26.95 kg/m  Physical Exam Gen: NAD, alert, cooperative with exam, well-appearing MSK:  Left shoulder: Normal active flexion abduction. Normal internal and external rotation. Pain with empty can testing. Neurovascularly intact   Aspiration/Injection Procedure Note JUSTYNA TIMONEY [birth year]  Procedure: Injection Indications: Left shoulder pain  Procedure Details Consent: Risks of procedure as well as the alternatives and risks of each were explained to the (patient/caregiver).  Consent for procedure obtained. Time Out: Verified patient identification, verified procedure, site/side was marked, verified correct patient position,  special equipment/implants available, medications/allergies/relevent history reviewed, required imaging and test results available.  Performed.  The area was cleaned with iodine and alcohol swabs.    The left subacromial space was injected using 1 cc's of 40 mg Depo-Medrol and 4 cc's of 0.25% bupivacaine with a 22 1 1/2" needle.  Ultrasound was used. Images were obtained in long views showing the injection.     A sterile dressing was applied.  Patient did tolerate procedure well.     ASSESSMENT & PLAN:   Tendinosis of rotator cuff Acutely worsening.  MRIs demonstrated different areas of possible contribution of her pain. -Injection. -Counseled on home exercise therapy and supportive care. -Referral to  surgery

## 2019-05-05 NOTE — Assessment & Plan Note (Signed)
Acutely worsening.  MRIs demonstrated different areas of possible contribution of her pain. -Injection. -Counseled on home exercise therapy and supportive care. -Referral to surgery

## 2019-05-06 ENCOUNTER — Telehealth: Payer: Self-pay | Admitting: Family Medicine

## 2019-05-06 NOTE — Telephone Encounter (Signed)
Patient called requesting medication to help her sleep. States this was discussed at office visit yesterday   [facility] [address]

## 2019-05-07 MED ORDER — HYDROCODONE-ACETAMINOPHEN 5-325 MG PO TABS: 1.0000 | ORAL_TABLET | Freq: Three times a day (TID) | ORAL | 0 refills | Status: DC | PRN

## 2019-05-07 NOTE — Telephone Encounter (Signed)
Discussed her pain. Will send in norco.   Myra Rude, MD [facility] 05/07/2019, 5:30 PM

## 2019-06-09 ENCOUNTER — Ambulatory Visit: Payer: BC Managed Care – PPO | Admitting: Family Medicine

## 2019-06-09 NOTE — Progress Notes (Deleted)
  [name] - 54 y.o. female MRN [id number]  Date of birth: May 11, 1965  SUBJECTIVE:  Including CC & ROS.  No chief complaint on file.   [name] is a 54 y.o. female that is  ***.  ***   Review of Systems See HPI   HISTORY: Past Medical, Surgical, Social, and Family History Reviewed & Updated per EMR.   Pertinent Historical Findings include:  Past Medical History:  Diagnosis Date  . Allergic reaction   . Diabetes mellitus   . Hypertension     Past Surgical History:  Procedure Laterality Date  . THYROIDECTOMY    . TONSILLECTOMY    . TUBAL LIGATION  1990  . uterine ablation  2008    No family history on file.  Social History   Socioeconomic History  . Marital status: Married    Spouse name: Not on file  . Number of children: Not on file  . Years of education: Not on file  . Highest education level: Not on file  Occupational History  . Not on file  Tobacco Use  . Smoking status: Never Smoker  . Smokeless tobacco: Never Used  Substance and Sexual Activity  . Alcohol use: No  . Drug use: No  . Sexual activity: Not on file  Other Topics Concern  . Not on file  Social History Narrative  . Not on file   Social Determinants of Health   Financial Resource Strain:   . Difficulty of Paying Living Expenses:   Food Insecurity:   . Worried About Programme researcher, broadcasting/film/video in the Last Year:   . Barista in the Last Year:   Transportation Needs:   . Freight forwarder (Medical):   Marland Kitchen Lack of Transportation (Non-Medical):   Physical Activity:   . Days of Exercise per Week:   . Minutes of Exercise per Session:   Stress:   . Feeling of Stress :   Social Connections:   . Frequency of Communication with Friends and Family:   . Frequency of Social Gatherings with Friends and Family:   . Attends Religious Services:   . Active Member of Clubs or Organizations:   . Attends Banker Meetings:   Marland Kitchen Marital Status:   Intimate Partner Violence:    . Fear of Current or Ex-Partner:   . Emotionally Abused:   Marland Kitchen Physically Abused:   . Sexually Abused:      PHYSICAL EXAM:  VS: There were no vitals taken for this visit. Physical Exam Gen: NAD, alert, cooperative with exam, well-appearing MSK:  ***      ASSESSMENT & PLAN:   No problem-specific Assessment & Plan notes found for this encounter.

## 2020-04-11 ENCOUNTER — Other Ambulatory Visit: Payer: Self-pay

## 2020-04-11 ENCOUNTER — Emergency Department (HOSPITAL_BASED_OUTPATIENT_CLINIC_OR_DEPARTMENT_OTHER): Payer: BC Managed Care – PPO

## 2020-04-11 ENCOUNTER — Inpatient Hospital Stay (HOSPITAL_BASED_OUTPATIENT_CLINIC_OR_DEPARTMENT_OTHER)
Admission: EM | Admit: 2020-04-11 | Discharge: 2020-04-14 | DRG: 690 | Disposition: A | Discharge status: Home / Self Care | Payer: BC Managed Care – PPO | Attending: Internal Medicine | Admitting: Internal Medicine

## 2020-04-11 ENCOUNTER — Encounter (HOSPITAL_BASED_OUTPATIENT_CLINIC_OR_DEPARTMENT_OTHER): Payer: Self-pay | Admitting: *Deleted

## 2020-04-11 DIAGNOSIS — Z833 Family history of diabetes mellitus: Secondary | ICD-10-CM

## 2020-04-11 DIAGNOSIS — Z885 Allergy status to narcotic agent status: Secondary | ICD-10-CM

## 2020-04-11 DIAGNOSIS — B9620 Unspecified Escherichia coli [E. coli] as the cause of diseases classified elsewhere: Secondary | ICD-10-CM | POA: Diagnosis present

## 2020-04-11 DIAGNOSIS — N10 Acute tubulo-interstitial nephritis: Principal | ICD-10-CM | POA: Diagnosis present

## 2020-04-11 DIAGNOSIS — Z7984 Long term (current) use of oral hypoglycemic drugs: Secondary | ICD-10-CM

## 2020-04-11 DIAGNOSIS — Z20822 Contact with and (suspected) exposure to covid-19: Secondary | ICD-10-CM | POA: Diagnosis present

## 2020-04-11 DIAGNOSIS — Z886 Allergy status to analgesic agent status: Secondary | ICD-10-CM

## 2020-04-11 DIAGNOSIS — Z880 Allergy status to penicillin: Secondary | ICD-10-CM

## 2020-04-11 DIAGNOSIS — E119 Type 2 diabetes mellitus without complications: Secondary | ICD-10-CM

## 2020-04-11 DIAGNOSIS — N12 Tubulo-interstitial nephritis, not specified as acute or chronic: Secondary | ICD-10-CM | POA: Diagnosis not present

## 2020-04-11 DIAGNOSIS — N201 Calculus of ureter: Secondary | ICD-10-CM | POA: Diagnosis present

## 2020-04-11 DIAGNOSIS — Z888 Allergy status to other drugs, medicaments and biological substances status: Secondary | ICD-10-CM

## 2020-04-11 DIAGNOSIS — E039 Hypothyroidism, unspecified: Secondary | ICD-10-CM

## 2020-04-11 DIAGNOSIS — I10 Essential (primary) hypertension: Secondary | ICD-10-CM

## 2020-04-11 HISTORY — DX: Sarcoidosis, unspecified: D86.9

## 2020-04-11 HISTORY — DX: Hypothyroidism, unspecified: E03.9

## 2020-04-11 LAB — BASIC METABOLIC PANEL
Anion gap: 12 (ref 5–15)
BUN: 16 mg/dL (ref 6–20)
CO2: 20 mmol/L — ABNORMAL LOW (ref 22–32)
Calcium: 8.9 mg/dL (ref 8.9–10.3)
Chloride: 100 mmol/L (ref 98–111)
Creatinine, Ser: 0.7 mg/dL (ref 0.44–1.00)
GFR, Estimated: >60 mL/min (ref >60)
Glucose, Bld: 169 mg/dL — ABNORMAL HIGH (ref 70–99)
Potassium: 3.8 mmol/L (ref 3.5–5.1)
Sodium: 132 mmol/L — ABNORMAL LOW (ref 135–145)

## 2020-04-11 LAB — CBC
HCT: 41.5 % (ref 36.0–46.0)
Hemoglobin: 13.7 g/dL (ref 12.0–15.0)
MCH: 29.4 pg (ref 26.0–34.0)
MCHC: 33 g/dL (ref 30.0–36.0)
MCV: 89.1 fL (ref 80.0–100.0)
Platelets: 288 10*3/uL (ref 150–400)
RBC: 4.66 MIL/uL (ref 3.87–5.11)
RDW: 13.5 % (ref 11.5–15.5)
WBC: 10 10*3/uL (ref 4.0–10.5)
nRBC: 0 % (ref 0.0–0.2)

## 2020-04-11 LAB — URINALYSIS, MICROSCOPIC (REFLEX)

## 2020-04-11 LAB — URINALYSIS, ROUTINE W REFLEX MICROSCOPIC
Bilirubin Urine: NEGATIVE
Glucose, UA: >=500 mg/dL — AB
Ketones, ur: 40 mg/dL — AB
Nitrite: POSITIVE — AB
Protein, ur: 30 mg/dL — AB
Specific Gravity, Urine: 1.015 (ref 1.005–1.030)
pH: 5.5 (ref 5.0–8.0)

## 2020-04-11 LAB — PREGNANCY, URINE: Preg Test, Ur: NEGATIVE

## 2020-04-11 MED ORDER — ONDANSETRON HCL 4 MG/2ML IJ SOLN
4.0000 mg | Freq: Once | INTRAMUSCULAR | Status: AC
  Administered 2020-04-11: 4 mg via INTRAVENOUS
  Filled 2020-04-11: qty 2

## 2020-04-11 MED ORDER — SODIUM CHLORIDE 0.9 % IV SOLN
INTRAVENOUS | Status: DC | PRN
  Administered 2020-04-12: 500 mL via INTRAVENOUS

## 2020-04-11 MED ORDER — HYDROCODONE-ACETAMINOPHEN 5-325 MG PO TABS
1.0000 | ORAL_TABLET | Freq: Once | ORAL | Status: AC
  Administered 2020-04-11: 1 via ORAL
  Filled 2020-04-11: qty 1

## 2020-04-11 NOTE — ED Triage Notes (Signed)
2 days ago she had dysuria. Now she has left flank pain. Urinary frequency.

## 2020-04-11 NOTE — ED Provider Notes (Signed)
[facility] EMERGENCY DEPARTMENT Provider Note   CSN: [id number] Arrival date & time: 04/11/20  1829     History Chief Complaint  Patient presents with  . Flank Pain    [name] is a 55 y.o. female with a past medical history significant for diabetes and hypertension who presents to the ED due to urinary frequency and urgency x2 days associated with left back pain.  Patient states back pain began today and radiates to the left side of her abdomen.  Admits to subjective fever and chills.  Denies dysuria and hematuria.  Denies vaginal symptoms.  Patient is in a monogamous relationship with her husband and has no concern for STDs.  Admits to nausea, but denies vomiting.  She has been taking meloxicam with moderate relief.  No history of kidney stones. No previous abdominal operations.  History obtained from patient and past medical records. No interpreter used during encounter.        Past Medical History:  Diagnosis Date  . Allergic reaction   . Diabetes mellitus   . Hypertension     Patient Active Problem List   Diagnosis Date Noted  . Pyelonephritis 04/11/2020  . Biceps tendinopathy 02/09/2019  . Tendinosis of rotator cuff 07/30/2018  . ASTHMA 10/28/2006  . HYPERSOMNIA 10/28/2006    Past Surgical History:  Procedure Laterality Date  . THYROIDECTOMY    . TONSILLECTOMY    . TUBAL LIGATION  1990  . uterine ablation  2008     OB History   No obstetric history on file.     No family history on file.  Social History   Tobacco Use  . Smoking status: Never Smoker  . Smokeless tobacco: Never Used  Vaping Use  . Vaping Use: Never used  Substance Use Topics  . Alcohol use: No  . Drug use: No    Home Medications Prior to Admission medications   Medication Sig Start Date End Date Taking? Authorizing Provider  Empagliflozin-metFORMIN HCl ER (SYNJARDY XR) 12.05-998 MG TB24 Take 1 tablet by mouth daily. 01/11/20  Yes [provider]   levothyroxine (SYNTHROID, LEVOTHROID) 100 MCG tablet Take 100 mcg by mouth daily.   Yes [provider]  losartan (COZAAR) 50 MG tablet Take 50 mg by mouth daily.   Yes [provider]  albuterol (PROVENTIL HFA;VENTOLIN HFA) 108 (90 Base) MCG/ACT inhaler Inhale 4 puffs into the lungs every 4 (four) hours as needed for wheezing or shortness of breath. 02/04/16   Mesner, Barbara CowerJason, MD  atorvastatin (LIPITOR) 10 MG tablet Take 10 mg by mouth daily.    [provider]  azithromycin (ZITHROMAX) 250 MG tablet Take 1 tablet (250 mg total) by mouth daily. Take 1 every day until finished. 02/04/16   Mesner, Barbara CowerJason, MD  cyclobenzaprine (FLEXERIL) 10 MG tablet Take 1 tablet (10 mg total) by mouth 3 (three) times daily as needed for muscle spasms. 01/10/19   Petrucelli, Samantha R, PA-C  diazepam (VALIUM) 5 MG tablet Take 30 minutes prior to procedure. Can repeat x 1 if needed. 02/03/19   Myra RudeSchmitz, Jeremy E, MD  Dulaglutide (TRULICITY [place]) Inject into the skin.    [provider]  empagliflozin (JARDIANCE) 10 MG TABS tablet Take 10 mg by mouth daily.    [provider]  HYDROcodone-acetaminophen (NORCO/VICODIN) 5-325 MG tablet Take 1 tablet by mouth every 8 (eight) hours as needed. 05/07/19   Myra RudeSchmitz, Jeremy E, MD  lidocaine (LIDODERM) 5 % Place 1 patch onto the skin  daily. Apply patch to area of most significant pain. Remove & Discard patch within 12 hours. 01/10/19   Petrucelli, Samantha R, PA-C  Meloxicam 10 MG CAPS Take 10 mg by mouth daily as needed (pain). 01/10/19   Petrucelli, Pleas Koch, PA-C  metFORMIN (GLUMETZA) 1000 MG (MOD) 24 hr tablet Take 1,000 mg by mouth daily with breakfast.    [provider]  methocarbamol (ROBAXIN) 500 MG tablet Take 1 tablet (500 mg total) by mouth 2 (two) times daily as needed for muscle spasms. 04/08/19   Myra Rude, MD  nitroGLYCERIN (NITRODUR - DOSED IN MG/24 HR) 0.2 mg/hr patch Cut and apply 1/4 patch to most painful area  q24h. 08/26/18   Myra Rude, MD  nortriptyline (PAMELOR) 75 MG capsule Take 75 mg by mouth 3 (three) times daily.    [provider]  predniSONE (DELTASONE) 5 MG tablet Take 6 pills for first day, 5 pills second day, 4 pills third day, 3 pills fourth day, 2 pills the fifth day, and 1 pill sixth day. 01/12/19   Myra Rude, MD  pregabalin (LYRICA) 100 MG capsule Take 100 mg by mouth 2 (two) times daily.    [provider]  simvastatin (ZOCOR) 10 MG tablet Take 10 mg by mouth at bedtime.    12/09/12  [provider]  sitaGLIPtin (JANUVIA) 25 MG tablet Take 25 mg by mouth daily.    12/09/12  [provider]    Allergies    Aspirin, Codeine, Lisinopril, and Penicillins  Review of Systems   Review of Systems  Constitutional: Positive for chills and fever.  Gastrointestinal: Positive for abdominal pain and nausea. Negative for diarrhea and vomiting.  Genitourinary: Positive for frequency and urgency. Negative for vaginal discharge.  Musculoskeletal: Positive for back pain.  All other systems reviewed and are negative.   Physical Exam Updated Vital Signs BP 113/63   Pulse 90   Temp 99.8 F (37.7 C)   Resp 16   Ht 5\' 4"  (1.626 m)   Wt 66.2 kg   SpO2 98%   BMI 25.06 kg/m   Physical Exam Vitals and nursing note reviewed.  Constitutional:      General: She is not in acute distress.    Appearance: She is not ill-appearing.  HENT:     Head: Normocephalic.  Eyes:     Pupils: Pupils are equal, round, and reactive to light.  Cardiovascular:     Rate and Rhythm: Normal rate and regular rhythm.     Pulses: Normal pulses.     Heart sounds: Normal heart sounds. No murmur heard. No friction rub. No gallop.   Pulmonary:     Effort: Pulmonary effort is normal.     Breath sounds: Normal breath sounds.  Abdominal:     General: Abdomen is flat. Bowel sounds are normal. There is no distension.     Palpations: Abdomen is soft.     Tenderness: There  is abdominal tenderness. There is left CVA tenderness and guarding. There is no right CVA tenderness or rebound.     Comments: RLQ tenderness with voluntary guarding. Negative psoas sign. Left CVA tenderness  Musculoskeletal:        General: Normal range of motion.     Cervical back: Neck supple.  Skin:    General: Skin is warm and dry.  Neurological:     General: No focal deficit present.     Mental Status: She is alert.  Psychiatric:  Mood and Affect: Mood normal.        Behavior: Behavior normal.     ED Results / Procedures / Treatments   Labs (all labs ordered are listed, but only abnormal results are displayed) Labs Reviewed  URINALYSIS, ROUTINE W REFLEX MICROSCOPIC - Abnormal; Notable for the following components:      Result Value   APPearance CLOUDY (*)    Glucose, UA >=500 (*)    Hgb urine dipstick LARGE (*)    Ketones, ur 40 (*)    Protein, ur 30 (*)    Nitrite POSITIVE (*)    Leukocytes,Ua MODERATE (*)    All other components within normal limits  BASIC METABOLIC PANEL - Abnormal; Notable for the following components:   Sodium 132 (*)    CO2 20 (*)    Glucose, Bld 169 (*)    All other components within normal limits  URINALYSIS, MICROSCOPIC (REFLEX) - Abnormal; Notable for the following components:   Bacteria, UA FEW (*)    All other components within normal limits  URINE CULTURE  RESP PANEL BY RT-PCR (FLU A&B, COVID) ARPGX2  CBC  PREGNANCY, URINE    EKG None  Radiology CT RENAL STONE STUDY  Result Date: 04/11/2020 CLINICAL DATA:  Left upper quadrant flank pain EXAM: CT ABDOMEN AND PELVIS WITHOUT CONTRAST TECHNIQUE: Multidetector CT imaging of the abdomen and pelvis was performed following the standard protocol without IV contrast. COMPARISON:  CT dated August 05, 2014 FINDINGS: Lower chest: There is scattered pulmonary nodules at the lung bases bilaterally that are consistent with the patient's reported history of sarcoidosis.The heart size is normal.  Hepatobiliary: The liver is normal. Normal gallbladder.There is no biliary ductal dilation. Pancreas: Normal contours without ductal dilatation. No peripancreatic fluid collection. Spleen: Unremarkable. Adrenals/Urinary Tract: --Adrenal glands: Unremarkable. --Right kidney/ureter: No hydronephrosis or radiopaque kidney stones. --Left kidney/ureter: There is fullness of the left renal collecting system. There is fat stranding about the left kidney. There is a questionable stone in the distal left ureter measuring approximately 2 mm (axial series 2, image 64). The ureter at this level is difficult to follow. --Urinary bladder: Unremarkable. Stomach/Bowel: --Stomach/Duodenum: No hiatal hernia or other gastric abnormality. Normal duodenal course and caliber. --Small bowel: Unremarkable. --Colon: Unremarkable. --Appendix: Normal. Vascular/Lymphatic: Normal course and caliber of the major abdominal vessels. --No retroperitoneal lymphadenopathy. --No mesenteric lymphadenopathy. --No pelvic or inguinal lymphadenopathy. Reproductive: Unremarkable Other: No ascites or free air. The abdominal wall is normal. Musculoskeletal. No acute displaced fractures. IMPRESSION: There is fullness of the left renal collecting system with fat stranding about the left kidney. There is a questionable 2 mm stone in the distal left ureter. Correlation with urinalysis is recommended as pyelonephritis is within the differential diagnosis. Electronically Signed   By: Katherine Mantle M.D.   On: 04/11/2020 19:24    Procedures Procedures   Medications Ordered in ED Medications  0.9 %  sodium chloride infusion (has no administration in time range)  ciprofloxacin (CIPRO) IVPB 400 mg (has no administration in time range)  sodium chloride 0.9 % bolus 1,000 mL (has no administration in time range)  ondansetron (ZOFRAN) injection 4 mg (4 mg Intravenous Given 04/11/20 2113)  HYDROcodone-acetaminophen (NORCO/VICODIN) 5-325 MG per tablet 1 tablet  (1 tablet Oral Given 04/11/20 2112)    ED Course  I have reviewed the triage vital signs and the nursing notes.  Pertinent labs & imaging results that were available during my care of the patient were reviewed by me and considered in  my medical decision making (see chart for details).  Clinical Course as of 04/12/20 0002  Mon Apr 11, 2020  2221 Sodium(!): 132 [CA]  2221 CO2(!): 20 [CA]  2221 Glucose(!): 169 [CA]  2221 Hgb urine dipstick(!): LARGE [CA]  2221 Nitrite(!): POSITIVE [CA]  2221 Glori Luis): MODERATE [CA]  2343 Discussed case with Dr. Arita Miss with urology who notes given questionable 83mm stone, no intervention needed at this time. Can re-consult in the AM if anything changes.  [CA]    Clinical Course User Index [CA] Mannie Stabile, PA-C   MDM Rules/Calculators/A&P                         55 year old female presents to the ED due to urinary frequency and urgency associated with left back pain x2 days. No history of kidney stones. Stable vitals. Patient in no acute distress. Physical exam significant for left CVA tenderness and RLQ tenderness. Labs, UA, and CT renal study ordered at triage. Hydrocodone and zofran given for symptomatic relief. Suspect symptoms related to pyelonephritis vs. Kidney stone.  UA significant for nitrites, leukocytes, large hematuria, proteinuria, ketonuria, glucosuria.  Negative pregnancy.  CBC unremarkable no leukocytosis and normal hemoglobin.  BMP significant for hyponatremia 132 and hyperglycemia 169.  No anion gap.  Doubt DKA. Urine culture pending. CT renal study personally reviewed which demonstrates: IMPRESSION:  There is fullness of the left renal collecting system with fat  stranding about the left kidney. There is a questionable 2 mm stone  in the distal left ureter. Correlation with urinalysis is  recommended as pyelonephritis is within the differential diagnosis.   IVFs and IV antibiotics started for suspected pyelonephritis.  Patient allergic to PCN, so ciprofloxacin started. Discussed case with urology. See note above. Discussed case with Dr. Toniann Fail with [facility] who agrees to admit patient for further evaluation. COVID test ordered.   Discussed case with Dr. Madilyn Hook who evaluated patient at bedside and agrees with Final Clinical Impression(s) / ED Diagnoses Final diagnoses:  Pyelonephritis    Rx / DC Orders ED Discharge Orders    None       Jesusita Oka 04/12/20 0005    Tilden Fossa, MD 04/13/20 0030

## 2020-04-11 NOTE — ED Triage Notes (Signed)
Emergency Medicine Provider Triage Evaluation Note  NATTALIE SANTIESTEBAN , a 54 y.o. female  was evaluated in triage.  Pt complains of UTI and flank pain.  Patient has had burning, frequency urgency now complaining of abdominal pain radiating to the left flank.  No history of kidney stones.  She has had nausea but no vomiting.  Off-and-on cold chills..  Review of Systems  Positive: Urinary symptoms and flank pain Negative: Vomiting  Physical Exam  Ht 5\' 4"  (1.626 m)   Wt 66.2 kg   BMI 25.06 kg/m  Gen:   Awake, no distress   HEENT:  Atraumatic  Resp:  Normal effort  Cardiac:  Normal rate  Abd:   Suprapubic and left flank tenderness MSK:   Moves extremities without difficulty  Neuro:  Speech clear   Medical Decision Making  Medically screening exam initiated at 6:39 PM.  Appropriate orders placed.  Adyson E Degeorge was informed that the remainder of the evaluation will be completed by another provider, this initial triage assessment does not replace that evaluation, and the importance of remaining in the ED until their evaluation is complete.  Clinical Impression  Question Pilo versus kidney stone, labs and imaging ordered.   Neva Seat, PA-C 04/11/20 2002

## 2020-04-12 ENCOUNTER — Encounter (HOSPITAL_COMMUNITY): Payer: Self-pay | Admitting: Internal Medicine

## 2020-04-12 DIAGNOSIS — N10 Acute tubulo-interstitial nephritis: Principal | ICD-10-CM

## 2020-04-12 DIAGNOSIS — Z880 Allergy status to penicillin: Secondary | ICD-10-CM | POA: Diagnosis not present

## 2020-04-12 DIAGNOSIS — E039 Hypothyroidism, unspecified: Secondary | ICD-10-CM | POA: Diagnosis not present

## 2020-04-12 DIAGNOSIS — Z20822 Contact with and (suspected) exposure to covid-19: Secondary | ICD-10-CM | POA: Diagnosis not present

## 2020-04-12 DIAGNOSIS — I10 Essential (primary) hypertension: Secondary | ICD-10-CM

## 2020-04-12 DIAGNOSIS — Z888 Allergy status to other drugs, medicaments and biological substances status: Secondary | ICD-10-CM | POA: Diagnosis not present

## 2020-04-12 DIAGNOSIS — N201 Calculus of ureter: Secondary | ICD-10-CM | POA: Diagnosis not present

## 2020-04-12 DIAGNOSIS — N12 Tubulo-interstitial nephritis, not specified as acute or chronic: Secondary | ICD-10-CM | POA: Diagnosis present

## 2020-04-12 DIAGNOSIS — Z886 Allergy status to analgesic agent status: Secondary | ICD-10-CM | POA: Diagnosis not present

## 2020-04-12 DIAGNOSIS — Z885 Allergy status to narcotic agent status: Secondary | ICD-10-CM | POA: Diagnosis not present

## 2020-04-12 DIAGNOSIS — Z833 Family history of diabetes mellitus: Secondary | ICD-10-CM | POA: Diagnosis not present

## 2020-04-12 DIAGNOSIS — E119 Type 2 diabetes mellitus without complications: Secondary | ICD-10-CM

## 2020-04-12 DIAGNOSIS — Z7984 Long term (current) use of oral hypoglycemic drugs: Secondary | ICD-10-CM | POA: Diagnosis not present

## 2020-04-12 DIAGNOSIS — B9620 Unspecified Escherichia coli [E. coli] as the cause of diseases classified elsewhere: Secondary | ICD-10-CM | POA: Diagnosis not present

## 2020-04-12 LAB — GLUCOSE, CAPILLARY
Glucose-Capillary: 109 mg/dL — ABNORMAL HIGH (ref 70–99)
Glucose-Capillary: 216 mg/dL — ABNORMAL HIGH (ref 70–99)
Glucose-Capillary: 205 mg/dL — ABNORMAL HIGH (ref 70–99)
Glucose-Capillary: 167 mg/dL — ABNORMAL HIGH (ref 70–99)

## 2020-04-12 LAB — RESP PANEL BY RT-PCR (FLU A&B, COVID) ARPGX2
Influenza A by PCR: NEGATIVE
Influenza B by PCR: NEGATIVE
SARS Coronavirus 2 by RT PCR: NEGATIVE

## 2020-04-12 MED ORDER — ACETAMINOPHEN 650 MG RE SUPP: 650.0000 mg | Freq: Four times a day (QID) | RECTAL | Status: DC | PRN

## 2020-04-12 MED ORDER — MORPHINE SULFATE (PF) 4 MG/ML IV SOLN
4.0000 mg | Freq: Once | INTRAVENOUS | Status: AC
  Administered 2020-04-12: 4 mg via INTRAVENOUS
  Filled 2020-04-12: qty 1

## 2020-04-12 MED ORDER — TAMSULOSIN HCL 0.4 MG PO CAPS
0.4000 mg | ORAL_CAPSULE | Freq: Every day | ORAL | Status: DC
  Administered 2020-04-12 – 2020-04-14 (×3): 0.4 mg via ORAL
  Filled 2020-04-12 (×3): qty 1

## 2020-04-12 MED ORDER — ENOXAPARIN SODIUM 40 MG/0.4ML ~~LOC~~ SOLN
40.0000 mg | SUBCUTANEOUS | Status: DC
  Administered 2020-04-12 – 2020-04-13 (×2): 40 mg via SUBCUTANEOUS
  Filled 2020-04-12 (×2): qty 0.4

## 2020-04-12 MED ORDER — LEVOTHYROXINE SODIUM 100 MCG PO TABS
100.0000 ug | ORAL_TABLET | Freq: Every day | ORAL | Status: DC
  Administered 2020-04-13 – 2020-04-14 (×2): 100 ug via ORAL
  Filled 2020-04-12 (×2): qty 1

## 2020-04-12 MED ORDER — ONDANSETRON HCL 4 MG PO TABS
4.0000 mg | ORAL_TABLET | Freq: Four times a day (QID) | ORAL | Status: DC | PRN
  Administered 2020-04-13: 4 mg via ORAL
  Filled 2020-04-12: qty 1

## 2020-04-12 MED ORDER — HYDROCODONE-ACETAMINOPHEN 5-325 MG PO TABS
1.0000 | ORAL_TABLET | ORAL | Status: DC | PRN
  Administered 2020-04-12 (×3): 1 via ORAL
  Administered 2020-04-13 (×3): 2 via ORAL
  Administered 2020-04-13: 1 via ORAL
  Administered 2020-04-14: 2 via ORAL
  Filled 2020-04-12 (×4): qty 2
  Filled 2020-04-12 (×4): qty 1

## 2020-04-12 MED ORDER — LOSARTAN POTASSIUM 50 MG PO TABS
50.0000 mg | ORAL_TABLET | Freq: Every day | ORAL | Status: DC
  Administered 2020-04-12 – 2020-04-14 (×3): 50 mg via ORAL
  Filled 2020-04-12 (×3): qty 1

## 2020-04-12 MED ORDER — MORPHINE SULFATE (PF) 2 MG/ML IV SOLN
1.0000 mg | INTRAVENOUS | Status: DC | PRN
  Administered 2020-04-12 (×2): 1 mg via INTRAVENOUS
  Filled 2020-04-12 (×2): qty 1

## 2020-04-12 MED ORDER — ONDANSETRON HCL 4 MG/2ML IJ SOLN: 4.0000 mg | Freq: Four times a day (QID) | INTRAMUSCULAR | Status: DC | PRN

## 2020-04-12 MED ORDER — SODIUM CHLORIDE 0.9% FLUSH
3.0000 mL | Freq: Two times a day (BID) | INTRAVENOUS | Status: DC
  Administered 2020-04-12 – 2020-04-14 (×3): 3 mL via INTRAVENOUS

## 2020-04-12 MED ORDER — CIPROFLOXACIN IN D5W 400 MG/200ML IV SOLN
400.0000 mg | Freq: Two times a day (BID) | INTRAVENOUS | Status: DC
  Administered 2020-04-12 (×2): 400 mg via INTRAVENOUS
  Filled 2020-04-12 (×2): qty 200

## 2020-04-12 MED ORDER — SODIUM CHLORIDE 0.9 % IV BOLUS
1000.0000 mL | Freq: Once | INTRAVENOUS | Status: AC
  Administered 2020-04-12: 1000 mL via INTRAVENOUS

## 2020-04-12 MED ORDER — INSULIN ASPART 100 UNIT/ML ~~LOC~~ SOLN
0.0000 [IU] | Freq: Three times a day (TID) | SUBCUTANEOUS | Status: DC
  Administered 2020-04-12 (×2): 2 [IU] via SUBCUTANEOUS

## 2020-04-12 MED ORDER — CIPROFLOXACIN IN D5W 400 MG/200ML IV SOLN
400.0000 mg | Freq: Once | INTRAVENOUS | Status: AC
  Administered 2020-04-12: 400 mg via INTRAVENOUS
  Filled 2020-04-12: qty 200

## 2020-04-12 MED ORDER — ACETAMINOPHEN 325 MG PO TABS: 650.0000 mg | ORAL_TABLET | Freq: Four times a day (QID) | ORAL | Status: DC | PRN

## 2020-04-12 NOTE — H&P (Signed)
History and Physical  [name] RJJ:[id number] DOB: [birth year] DOA: 04/11/2020  PCP: Truett Perna, MD   Chief Complaint: left flank pain   HPI:  55 year old woman PMH diabetes mellitus, hypertension presented to the emergency department with left flank pain and nausea.  Admitted for left-sided pyelonephritis, nonobstructive ureterolithiasis.  Symptoms began several days ago with left flank pain which was sharp and intermittent, then radiated around to the abdomen.  Pain intensified without specific aggravating or alleviating factors.  Polyuria without dysuria.  Does not feel like she empties her bladder.  No previous history of pyelonephritis or nephrolithiasis that she is aware of.  ED Course: Treated with ciprofloxacin and hydrocodone and morphine without adverse events.  Transferred to [facility] for further treatment.  Neurology involved but no procedure planned.  Review of Systems:  No fever, visual changes, sore throat, rash, chest pain, shortness of breath, bleeding  PMH . Diabetes mellitus type 2 . Hypertension . Hypothyroidism . Sarcoidosis  PSH . Tonsillectomy . Thyroidectomy . Rotator cuff repair  Family history includes: . Mother with diabetes, brother died of sarcoma  Social History . Non-smoker, nondrinker no drugs  Allergies . Aspirin, codeine, penicillins cause hives.  Moxifloxacin caused a rash. Girtha Rm reviewed in epic  Meds include: Current Outpatient Medications  Medication Instructions  . Empagliflozin-metFORMIN HCl ER (SYNJARDY XR) 12.05-998 MG TB24 1 tablet, Oral, Daily  . levothyroxine (SYNTHROID) 100 mcg, Oral, Daily,    . losartan (COZAAR) 50 mg, Daily    Physicial Exam   Vitals:  Afebrile, 98.0, 18, 84, 01/23/1970, 98% on room air  Constitutional:   . Appears calm and comfortable eating breakfast Eyes:  . pupils and irises appear normal . Normal lids ENMT:  . grossly normal hearing  . Lips appear normal Neck:  . neck appears  normal, no masses . no thyromegaly Respiratory:  . CTA bilaterally, no w/r/r.  . Respiratory effort normal. Cardiovascular:  . RRR, no m/r/g . No LE extremity edema   Abdomen:  . Abdomen appears normal . Nondistended.  Mild epigastric, mid and right-sided abdominal pain.  No rebound or guarding. . No hernias noted. Musculoskeletal:  . Digits/nails BUE: no clubbing, cyanosis, petechiae, infection.  Artificial nails. . RUE, LUE, RLE, LLE   o strength and tone grossly normal, no atrophy, no abnormal movements o No tenderness, masses Skin:  . No rashes, lesions, ulcers . palpation of skin: no induration or nodules Psychiatric:  . Mental status o Mood, affect appropriate . judgment and insight appear intact    I have personally reviewed following labs and imaging studies  Labs:  Sodium 132, remainder BMP unremarkable CBC unremarkable Urine pregnancy negative Covid negative Urinalysis grossly abnormal  Imaging studies:   CT abdomen pelvis showed 2 mm stone distal left ureter.  Fullness left renal collecting system with fat stranding about the left kidney.   ASSESSMENT/PLAN  Left-sided pyelonephritis with associated flank and abdominal pain.  No signs or symptoms of sepsis. --Tolerated ciprofloxacin despite listed allergy to moxifloxacin.  Continue ciprofloxacin given penicillin allergy.  Tolerated morphine and hydrocodone despite listed codeine allergy.  We will continue analgesia as needed. --Diet as tolerated.  Left-sided ureteral lithiasis --Management per urology  Diabetes mellitus type 2 on oral medications. --Hold oral medications --Sliding scale insulin  Hypothyroidism --Continue levothyroxine  Essential hypertension --Stable.  Continue losartan.  DVT prophylaxis: enoxpaarin Code Status: Full confirmed w/ patient Family Communication: none Consults called: urology by EDP    Time spent: 50 minutes  Brendia Sacks, MD  [facility] Direct  contact: see [email or web address]  7PM-7AM contact night coverage as below   1. Check the care team in [facility] and look for a) attending/consulting [facility] provider listed and b) the [facility] team listed 2. Log into [email or web address] and use [employer]'s universal password to access. If you do not have the password, please contact the hospital operator. 3. Locate the [facility] provider you are looking for under [facility] and page to a number that you can be directly reached. 4. If you still have difficulty reaching the provider, please page the [facility] (Director on Call) for the Hospitalists listed on amion for assistance.  Severity of Illness: The appropriate patient status for this patient is OBSERVATION. Observation status is judged to be reasonable and necessary in order to provide the required intensity of service to ensure the patient's safety. The patient's presenting symptoms, physical exam findings, and initial radiographic and laboratory data in the context of their medical condition is felt to place them at decreased risk for further clinical deterioration. Furthermore, it is anticipated that the patient will be medically stable for discharge from the hospital within 2 midnights of admission. The following factors support the patient status of observation.   " The patient's presenting symptoms include left flank pain, polyuria. " The physical exam findings include left flank pain and abd pain. " The initial radiographic and laboratory data are notable for pyuria, CT shows kidney stranding, small stone.    Status is: Observation  The patient remains OBS appropriate and will d/c before 2 midnights.  Dispo: The patient is from: Home              Anticipated d/c is to: Home              Patient currently is not medically stable to d/c.   Difficult to place patient No        04/12/2020, 10:52 AM   Principal Problem:   Acute pyelonephritis Active Problems:   DM type 2 (diabetes mellitus, type 2)  (HCC)   Hypothyroidism   Benign essential HTN

## 2020-04-12 NOTE — Consult Note (Addendum)
I have been asked to see the patient by Dr. Brendia Sacks, for evaluation and management of pyelonephritis.  History of present illness: 55 year old woman who presented to the ED with 2 days of dysuria and left flank pain found with clinical evidence of left pyelonephritis on CT of the abdomen and pelvis.  There is also a questionable 2 mm calculus in the distal ureter.  Urinalysis is consistent with infection.  No fevers and WBC normal.    Review of systems: A 12 point comprehensive review of systems was obtained and is negative unless otherwise stated in the history of present illness.  Patient Active Problem List   Diagnosis Date Noted  . Pyelonephritis 04/11/2020  . Biceps tendinopathy 02/09/2019  . Tendinosis of rotator cuff 07/30/2018  . ASTHMA 10/28/2006  . HYPERSOMNIA 10/28/2006    No current facility-administered medications on file prior to encounter.   Current Outpatient Medications on File Prior to Encounter  Medication Sig Dispense Refill  . Empagliflozin-metFORMIN HCl ER (SYNJARDY XR) 12.05-998 MG TB24 Take 1 tablet by mouth daily.    Marland Kitchen levothyroxine (SYNTHROID, LEVOTHROID) 100 MCG tablet Take 100 mcg by mouth daily.    Marland Kitchen losartan (COZAAR) 50 MG tablet Take 50 mg by mouth daily.    . [DISCONTINUED] simvastatin (ZOCOR) 10 MG tablet Take 10 mg by mouth at bedtime.      . [DISCONTINUED] sitaGLIPtin (JANUVIA) 25 MG tablet Take 25 mg by mouth daily.        Past Medical History:  Diagnosis Date  . Allergic reaction   . Diabetes mellitus   . Hypertension     Past Surgical History:  Procedure Laterality Date  . THYROIDECTOMY    . TONSILLECTOMY    . TUBAL LIGATION  1990  . uterine ablation  2008    Social History   Tobacco Use  . Smoking status: Never Smoker  . Smokeless tobacco: Never Used  Vaping Use  . Vaping Use: Never used  Substance Use Topics  . Alcohol use: No  . Drug use: No    No family history on file.  PE: Vitals:   04/11/20 2231 04/12/20  0115 04/12/20 0206 04/12/20 0608  BP: 113/63 120/71 119/63 116/72  Pulse: 90 81 83 84  Resp: 16 18 20 18   Temp:   98.3 F (36.8 C) 98 F (36.7 C)  TempSrc:   Oral Oral  SpO2: 98% 100% 100% 98%  Weight:      Height:       Patient appears to be in no acute distress  patient is alert and oriented x3 Atraumatic normocephalic head No increased work of breathing Regular sinus rhythm/rate Abdomen is soft, mildly ttp epigastric area, +left CVA ttp Lower extremities are symmetric without appreciable edema Grossly neurologically intact No identifiable skin lesions  Recent Labs    04/11/20 2008  WBC 10.0  HGB 13.7  HCT 41.5   Recent Labs    04/11/20 2008  NA 132*  K 3.8  CL 100  CO2 20*  GLUCOSE 169*  BUN 16  CREATININE 0.70  CALCIUM 8.9   No results for input(s): LABPT, INR in the last 72 hours. No results for input(s): LABURIN in the last 72 hours. Results for orders placed or performed during the hospital encounter of 04/11/20  Resp Panel by RT-PCR (Flu A&B, Covid) Nasopharyngeal Swab     Status: None   Collection Time: 04/11/20 11:37 PM   Specimen: Nasopharyngeal Swab; Nasopharyngeal(NP) swabs in vial transport medium  Result  Value Ref Range Status   SARS Coronavirus 2 by RT PCR NEGATIVE NEGATIVE Final    Comment: (NOTE) SARS-CoV-2 target nucleic acids are NOT DETECTED.  The SARS-CoV-2 RNA is generally detectable in upper respiratory specimens during the acute phase of infection. The lowest concentration of SARS-CoV-2 viral copies this assay can detect is 138 copies/mL. A negative result does not preclude SARS-Cov-2 infection and should not be used as the sole basis for treatment or other patient management decisions. A negative result may occur with  improper specimen collection/handling, submission of specimen other than nasopharyngeal swab, presence of viral mutation(s) within the areas targeted by this assay, and inadequate number of viral copies(<138  copies/mL). A negative result must be combined with clinical observations, patient history, and epidemiological information. The expected result is Negative.  Fact Sheet for Patients:  BloggerCourse.com  Fact Sheet for Healthcare Providers:  SeriousBroker.it  This test is no t yet approved or cleared by the [place] FDA and  has been authorized for detection and/or diagnosis of SARS-CoV-2 by FDA under an Emergency Use Authorization (EUA). This EUA will remain  in effect (meaning this test can be used) for the duration of the COVID-19 declaration under Section 564(b)(1) of the Act, 21 U.S.C.section 360bbb-3(b)(1), unless the authorization is terminated  or revoked sooner.       Influenza A by PCR NEGATIVE NEGATIVE Final   Influenza B by PCR NEGATIVE NEGATIVE Final    Comment: (NOTE) The Xpert Xpress SARS-CoV-2/FLU/RSV plus assay is intended as an aid in the diagnosis of influenza from Nasopharyngeal swab specimens and should not be used as a sole basis for treatment. Nasal washings and aspirates are unacceptable for Xpert Xpress SARS-CoV-2/FLU/RSV testing.  Fact Sheet for Patients: BloggerCourse.com  Fact Sheet for Healthcare Providers: SeriousBroker.it  This test is not yet approved or cleared by the [place] FDA and has been authorized for detection and/or diagnosis of SARS-CoV-2 by FDA under an Emergency Use Authorization (EUA). This EUA will remain in effect (meaning this test can be used) for the duration of the COVID-19 declaration under Section 564(b)(1) of the Act, 21 U.S.C. section 360bbb-3(b)(1), unless the authorization is terminated or revoked.  Performed at [facility], [address] [place], [place] [address]     Imaging: CT Abd/Pelvis 04/11/20 IMPRESSION: There is fullness of the left renal collecting system with fat stranding about  the left kidney. There is a questionable 2 mm stone in the distal left ureter. Correlation with urinalysis is recommended as pyelonephritis is within the differential diagnosis.     Electronically Signed   By: Katherine Mantle M.D.   On: 04/11/2020 19:24  Imp/Recommendations: 1. Left pyelonephritis: Recommend treating pyelonephritis according to urine culture antibiotic sensitivities   2. Possible ureteral calculus: Unclear if 2 mm calcification in the pelvis is in the distal ureter however given the size it will likely pass on its own and there is not significant hydronephrosis.  You can add Flomax 0.4 mg daily while hospitalized to see if this will help pass any possible stone.  If not clinically improving would repeat renal ultrasound to look for hydronephrosis taking into account cyclical nature of fevers with pyelonephritis.  No acute urologic intervention at this time.  Please contact Urology if patient clinically decompensates.    Thank you for involving me in this patient's care. Please page with any further questions or concerns. Jalil Lorusso D Kadience Macchi

## 2020-04-13 DIAGNOSIS — Z888 Allergy status to other drugs, medicaments and biological substances status: Secondary | ICD-10-CM | POA: Diagnosis not present

## 2020-04-13 DIAGNOSIS — Z880 Allergy status to penicillin: Secondary | ICD-10-CM | POA: Diagnosis not present

## 2020-04-13 DIAGNOSIS — Z886 Allergy status to analgesic agent status: Secondary | ICD-10-CM | POA: Diagnosis not present

## 2020-04-13 DIAGNOSIS — B9620 Unspecified Escherichia coli [E. coli] as the cause of diseases classified elsewhere: Secondary | ICD-10-CM | POA: Diagnosis present

## 2020-04-13 DIAGNOSIS — I10 Essential (primary) hypertension: Secondary | ICD-10-CM | POA: Diagnosis present

## 2020-04-13 DIAGNOSIS — N201 Calculus of ureter: Secondary | ICD-10-CM | POA: Diagnosis present

## 2020-04-13 DIAGNOSIS — E119 Type 2 diabetes mellitus without complications: Secondary | ICD-10-CM | POA: Diagnosis present

## 2020-04-13 DIAGNOSIS — Z7984 Long term (current) use of oral hypoglycemic drugs: Secondary | ICD-10-CM | POA: Diagnosis not present

## 2020-04-13 DIAGNOSIS — N12 Tubulo-interstitial nephritis, not specified as acute or chronic: Secondary | ICD-10-CM

## 2020-04-13 DIAGNOSIS — Z885 Allergy status to narcotic agent status: Secondary | ICD-10-CM | POA: Diagnosis not present

## 2020-04-13 DIAGNOSIS — N10 Acute tubulo-interstitial nephritis: Secondary | ICD-10-CM | POA: Diagnosis present

## 2020-04-13 DIAGNOSIS — Z833 Family history of diabetes mellitus: Secondary | ICD-10-CM | POA: Diagnosis not present

## 2020-04-13 DIAGNOSIS — Z20822 Contact with and (suspected) exposure to covid-19: Secondary | ICD-10-CM | POA: Diagnosis present

## 2020-04-13 LAB — HEMOGLOBIN A1C
Hgb A1c MFr Bld: 7.4 % — ABNORMAL HIGH (ref 4.8–5.6)
Mean Plasma Glucose: 165.68 mg/dL

## 2020-04-13 LAB — GLUCOSE, CAPILLARY
Glucose-Capillary: 126 mg/dL — ABNORMAL HIGH (ref 70–99)
Glucose-Capillary: 177 mg/dL — ABNORMAL HIGH (ref 70–99)
Glucose-Capillary: 178 mg/dL — ABNORMAL HIGH (ref 70–99)
Glucose-Capillary: 222 mg/dL — ABNORMAL HIGH (ref 70–99)

## 2020-04-13 LAB — URINE CULTURE: Culture: NO GROWTH

## 2020-04-13 LAB — HIV ANTIBODY (ROUTINE TESTING W REFLEX): HIV Screen 4th Generation wRfx: NONREACTIVE

## 2020-04-13 MED ORDER — INSULIN ASPART 100 UNIT/ML ~~LOC~~ SOLN: 0.0000 [IU] | Freq: Every day | SUBCUTANEOUS | Status: DC

## 2020-04-13 MED ORDER — SODIUM CHLORIDE 0.9 % IV SOLN
1.0000 g | INTRAVENOUS | Status: DC
  Administered 2020-04-13 – 2020-04-14 (×2): 1 g via INTRAVENOUS
  Filled 2020-04-13: qty 1
  Filled 2020-04-13: qty 10

## 2020-04-13 MED ORDER — INSULIN ASPART 100 UNIT/ML ~~LOC~~ SOLN
0.0000 [IU] | Freq: Three times a day (TID) | SUBCUTANEOUS | Status: DC
  Administered 2020-04-13 (×2): 3 [IU] via SUBCUTANEOUS
  Administered 2020-04-14: 2 [IU] via SUBCUTANEOUS
  Administered 2020-04-14: 3 [IU] via SUBCUTANEOUS

## 2020-04-13 MED ORDER — INSULIN ASPART 100 UNIT/ML ~~LOC~~ SOLN
4.0000 [IU] | Freq: Three times a day (TID) | SUBCUTANEOUS | Status: DC
  Administered 2020-04-13 – 2020-04-14 (×4): 4 [IU] via SUBCUTANEOUS

## 2020-04-13 NOTE — Progress Notes (Addendum)
[facility] PROGRESS NOTE    Progress Note  [name]  TKW:[id number] DOB: Dec 25, 1965 DOA: 04/11/2020 PCP: Truett Perna, MD     Brief Narrative:   [name] is an 55 y.o. female past medical history of diabetes mellitus type 2, essential hypertension presents to the emergency room with left flank pain and nausea, found to have nonobstructive urolithiasis with left-sided pyelonephritis   Significant studies: 04/11/2020 CT scan renal protocol showed fullness of the left renal collecting system with some stranding of the left kidney questionable 2 mm stone in the distal ureter  Antibiotics: IV Cipro 04/11/2020 2 04/12/2020. IV Rocephin for 06/27/2020  Microbiology data: Blood culture:  Procedures: None  Assessment/Plan:   Acute pyelonephritis Has an allergy to penicillins which is hive that is less than 5% cross-reactivity between penicillins and Rocephin, discontinue Cipro start him on IV Rocephin. Currently on narcotics for pain. Urine cultures and blood cultures have remained negative till date.  Left urethral lithiasis: Started on Flomax 2 mm stone will probably pass.  DM type 2 (diabetes mellitus, type 2) (HCC) Hold oral hypoglycemic agents blood glucose fairly controlled continue sliding scale.  Hypothyroidism Continue Synthroid.  Benign essential HTN Blood pressure relatively well controlled continue losartan.    DVT prophylaxis: lovenxo Family Communication: Status is: Observation  The patient will require care spanning > 2 midnights and should be moved to inpatient because: Hemodynamically unstable  Dispo: The patient is from: Home              Anticipated d/c is to: Home              Patient currently is not medically stable to d/c.   Difficult to place patient No        Code Status:     Code Status Orders  (From admission, onward)         Start     Ordered   04/12/20 1055  Full code  Continuous        04/12/20 1058         Code Status History    This patient has a current code status but no historical code status.   Advance Care Planning Activity        IV Access:    Peripheral IV   Procedures and diagnostic studies:   CT RENAL STONE STUDY  Result Date: 04/11/2020 CLINICAL DATA:  Left upper quadrant flank pain EXAM: CT ABDOMEN AND PELVIS WITHOUT CONTRAST TECHNIQUE: Multidetector CT imaging of the abdomen and pelvis was performed following the standard protocol without IV contrast. COMPARISON:  CT dated August 05, 2014 FINDINGS: Lower chest: There is scattered pulmonary nodules at the lung bases bilaterally that are consistent with the patient's reported history of sarcoidosis.The heart size is normal. Hepatobiliary: The liver is normal. Normal gallbladder.There is no biliary ductal dilation. Pancreas: Normal contours without ductal dilatation. No peripancreatic fluid collection. Spleen: Unremarkable. Adrenals/Urinary Tract: --Adrenal glands: Unremarkable. --Right kidney/ureter: No hydronephrosis or radiopaque kidney stones. --Left kidney/ureter: There is fullness of the left renal collecting system. There is fat stranding about the left kidney. There is a questionable stone in the distal left ureter measuring approximately 2 mm (axial series 2, image 64). The ureter at this level is difficult to follow. --Urinary bladder: Unremarkable. Stomach/Bowel: --Stomach/Duodenum: No hiatal hernia or other gastric abnormality. Normal duodenal course and caliber. --Small bowel: Unremarkable. --Colon: Unremarkable. --Appendix: Normal. Vascular/Lymphatic: Normal course and caliber of the major abdominal vessels. --No retroperitoneal lymphadenopathy. --No mesenteric  lymphadenopathy. --No pelvic or inguinal lymphadenopathy. Reproductive: Unremarkable Other: No ascites or free air. The abdominal wall is normal. Musculoskeletal. No acute displaced fractures. IMPRESSION: There is fullness of the left renal collecting system with fat  stranding about the left kidney. There is a questionable 2 mm stone in the distal left ureter. Correlation with urinalysis is recommended as pyelonephritis is within the differential diagnosis. Electronically Signed   By: Katherine Mantle M.D.   On: 04/11/2020 19:24     Medical Consultants:    None.   Subjective:    [name] she relates her pain is better, nausea has improved has been tolerating her diet.  Objective:    Vitals:   04/12/20 2046 04/12/20 2047 04/12/20 2120 04/13/20 0501  BP: (!) 96/59 (!) 100/47 (!) 106/50 115/74  Pulse: 77 76 72 73  Resp: 17   15  Temp: 98.6 F (37 C)   98.2 F (36.8 C)  TempSrc: Oral   Oral  SpO2: 97%   97%  Weight:      Height:       SpO2: 97 %   Intake/Output Summary (Last 24 hours) at 04/13/2020 0815 Last data filed at 04/13/2020 0600 Gross per 24 hour  Intake 1199.53 ml  Output 2400 ml  Net -1200.47 ml   Filed Weights   04/11/20 1833  Weight: 66.2 kg    Exam: General exam: In no acute distress. Respiratory system: Good air movement and clear to auscultation. Cardiovascular system: S1 & S2 heard, RRR. No JVD. Gastrointestinal system: Positive bowel sounds soft nontender nondistended Extremities: No pedal edema. Skin: No rashes, lesions or ulcers Psychiatry: Judgement and insight appear normal. Mood & affect appropriate.    Data Reviewed:    Labs: Basic Metabolic Panel: Recent Labs  Lab 04/11/20 2008  NA 132*  K 3.8  CL 100  CO2 20*  GLUCOSE 169*  BUN 16  CREATININE 0.70  CALCIUM 8.9   GFR Estimated Creatinine Clearance: 74.4 mL/min (by C-G formula based on SCr of 0.7 mg/dL). Liver Function Tests: No results for input(s): AST, ALT, ALKPHOS, BILITOT, PROT, ALBUMIN in the last 168 hours. No results for input(s): LIPASE, AMYLASE in the last 168 hours. No results for input(s): AMMONIA in the last 168 hours. Coagulation profile No results for input(s): INR, PROTIME in the last 168 hours. COVID-19  Labs  No results for input(s): DDIMER, FERRITIN, LDH, CRP in the last 72 hours.  Lab Results  Component Value Date   SARSCOV2NAA NEGATIVE 04/11/2020    CBC: Recent Labs  Lab 04/11/20 2008  WBC 10.0  HGB 13.7  HCT 41.5  MCV 89.1  PLT 288   Cardiac Enzymes: No results for input(s): CKTOTAL, CKMB, CKMBINDEX, TROPONINI in the last 168 hours. BNP (last 3 results) No results for input(s): PROBNP in the last 8760 hours. CBG: Recent Labs  Lab 04/12/20 0728 04/12/20 1141 04/12/20 1631 04/12/20 2053 04/13/20 0735  GLUCAP 109* 216* 205* 167* 126*   D-Dimer: No results for input(s): DDIMER in the last 72 hours. Hgb A1c: No results for input(s): HGBA1C in the last 72 hours. Lipid Profile: No results for input(s): CHOL, HDL, LDLCALC, TRIG, CHOLHDL, LDLDIRECT in the last 72 hours. Thyroid function studies: No results for input(s): TSH, T4TOTAL, T3FREE, THYROIDAB in the last 72 hours.  Invalid input(s): FREET3 Anemia work up: No results for input(s): VITAMINB12, FOLATE, FERRITIN, TIBC, IRON, RETICCTPCT in the last 72 hours. Sepsis Labs: Recent Labs  Lab 04/11/20 2008  WBC 10.0  Microbiology Recent Results (from the past 240 hour(s))  Resp Panel by RT-PCR (Flu A&B, Covid) Nasopharyngeal Swab     Status: None   Collection Time: 04/11/20 11:37 PM   Specimen: Nasopharyngeal Swab; Nasopharyngeal(NP) swabs in vial transport medium  Result Value Ref Range Status   SARS Coronavirus 2 by RT PCR NEGATIVE NEGATIVE Final    Comment: (NOTE) SARS-CoV-2 target nucleic acids are NOT DETECTED.  The SARS-CoV-2 RNA is generally detectable in upper respiratory specimens during the acute phase of infection. The lowest concentration of SARS-CoV-2 viral copies this assay can detect is 138 copies/mL. A negative result does not preclude SARS-Cov-2 infection and should not be used as the sole basis for treatment or other patient management decisions. A negative result may occur with   improper specimen collection/handling, submission of specimen other than nasopharyngeal swab, presence of viral mutation(s) within the areas targeted by this assay, and inadequate number of viral copies(<138 copies/mL). A negative result must be combined with clinical observations, patient history, and epidemiological information. The expected result is Negative.  Fact Sheet for Patients:  BloggerCourse.com  Fact Sheet for Healthcare Providers:  SeriousBroker.it  This test is no t yet approved or cleared by the [place] FDA and  has been authorized for detection and/or diagnosis of SARS-CoV-2 by FDA under an Emergency Use Authorization (EUA). This EUA will remain  in effect (meaning this test can be used) for the duration of the COVID-19 declaration under Section 564(b)(1) of the Act, 21 U.S.C.section 360bbb-3(b)(1), unless the authorization is terminated  or revoked sooner.       Influenza A by PCR NEGATIVE NEGATIVE Final   Influenza B by PCR NEGATIVE NEGATIVE Final    Comment: (NOTE) The Xpert Xpress SARS-CoV-2/FLU/RSV plus assay is intended as an aid in the diagnosis of influenza from Nasopharyngeal swab specimens and should not be used as a sole basis for treatment. Nasal washings and aspirates are unacceptable for Xpert Xpress SARS-CoV-2/FLU/RSV testing.  Fact Sheet for Patients: BloggerCourse.com  Fact Sheet for Healthcare Providers: SeriousBroker.it  This test is not yet approved or cleared by the [place] FDA and has been authorized for detection and/or diagnosis of SARS-CoV-2 by FDA under an Emergency Use Authorization (EUA). This EUA will remain in effect (meaning this test can be used) for the duration of the COVID-19 declaration under Section 564(b)(1) of the Act, 21 U.S.C. section 360bbb-3(b)(1), unless the authorization is terminated  or revoked.  Performed at [facility], [address] [place], [place] [address]   Urine Culture     Status: None   Collection Time: 04/12/20 12:02 PM   Specimen: Urine, Random  Result Value Ref Range Status   Specimen Description   Final    URINE, RANDOM Performed at [facility], [address] [place], [place] [address]    Special Requests   Final    NONE Performed at [facility], [address], [place], [place] [address]    Culture   Final    NO GROWTH Performed at [facility], [address] [place], [place] [address]    Report Status 04/13/2020 FINAL  Final     Medications:   . enoxaparin (LOVENOX) injection  40 mg Subcutaneous Q24H  . insulin aspart  0-6 Units Subcutaneous TID WC  . levothyroxine  100 mcg Oral Daily  . losartan  50 mg Oral Daily  . sodium chloride flush  3 mL Intravenous Q12H  .  tamsulosin  0.4 mg Oral Daily   Continuous Infusions: . sodium chloride 10 mL/hr at 04/12/20 0600  . ciprofloxacin 400 mg (04/12/20 2331)      LOS: 0 days   [name]  [facility]  04/13/2020, 8:15 AM

## 2020-04-14 DIAGNOSIS — N12 Tubulo-interstitial nephritis, not specified as acute or chronic: Secondary | ICD-10-CM | POA: Diagnosis not present

## 2020-04-14 DIAGNOSIS — N10 Acute tubulo-interstitial nephritis: Secondary | ICD-10-CM | POA: Diagnosis not present

## 2020-04-14 DIAGNOSIS — I10 Essential (primary) hypertension: Secondary | ICD-10-CM | POA: Diagnosis not present

## 2020-04-14 DIAGNOSIS — E119 Type 2 diabetes mellitus without complications: Secondary | ICD-10-CM | POA: Diagnosis not present

## 2020-04-14 LAB — URINE CULTURE: Culture: 20000 — AB

## 2020-04-14 LAB — GLUCOSE, CAPILLARY
Glucose-Capillary: 133 mg/dL — ABNORMAL HIGH (ref 70–99)
Glucose-Capillary: 176 mg/dL — ABNORMAL HIGH (ref 70–99)

## 2020-04-14 MED ORDER — TAMSULOSIN HCL 0.4 MG PO CAPS: 0.4000 mg | ORAL_CAPSULE | Freq: Every day | ORAL | 0 refills | Status: AC

## 2020-04-14 MED ORDER — FLUCONAZOLE 100 MG PO TABS: 100.0000 mg | ORAL_TABLET | Freq: Every day | ORAL | Status: DC

## 2020-04-14 MED ORDER — CEPHALEXIN 500 MG PO CAPS: 500.0000 mg | ORAL_CAPSULE | Freq: Four times a day (QID) | ORAL | 0 refills | Status: AC

## 2020-04-14 MED ORDER — CEPHALEXIN 500 MG PO CAPS: 500.0000 mg | ORAL_CAPSULE | Freq: Four times a day (QID) | ORAL | 0 refills | Status: DC

## 2020-04-14 MED ORDER — FLUCONAZOLE 100 MG PO TABS: 100.0000 mg | ORAL_TABLET | Freq: Every day | ORAL | 0 refills | Status: AC

## 2020-04-14 MED ORDER — FLUCONAZOLE IN SODIUM CHLORIDE 200-0.9 MG/100ML-% IV SOLN
200.0000 mg | Freq: Once | INTRAVENOUS | Status: AC
  Administered 2020-04-14: 200 mg via INTRAVENOUS
  Filled 2020-04-14: qty 100

## 2020-04-14 NOTE — Discharge Summary (Addendum)
Physician Discharge Summary  JERMIYAH RICOTTA ZOX:[id number] DOB: 09-Oct-1965 DOA: 04/11/2020  PCP: Truett Perna, MD  Admit date: 04/11/2020 Discharge date: 04/14/2020  Admitted From: Home Disposition:  Home  Recommendations for Outpatient Follow-up:  1. Follow up with PCP in 1-2 weeks 2. Please obtain BMP/CBC in one week   Home Health:No Equipment/Devices:None  Discharge Condition:stable CODE STATUS:Full Diet recommendation: Heart Healthy   Brief/Interim Summary: 55 y.o. female past medical history of diabetes mellitus type 2, essential hypertension presents to the emergency room with left flank pain and nausea, found to have nonobstructive urolithiasis with left-sided pyelonephritis  Discharge Diagnoses:  Principal Problem:   Acute pyelonephritis Active Problems:   DM type 2 (diabetes mellitus, type 2) (HCC)   Hypothyroidism   Benign essential HTN   Acute pyelonephritis: Had a penicillin allergy and there is less than 5% cross-reactivity she was started on IV Rocephin, urine cultures grew E. coli pansensitive she was changed to oral Keflex which should continue as an outpatient for a total of 10 days. Sepsis was rule out.  Left nephrolithiasis: Urology was consulted recommended to treat infection, she was started on Flomax which continue as an outpatient. Follow-up with urology as an outpatient.  Diabetes mellitus type 2: No change made to her medication.  Hypothyroidism: Continue Synthroid.  Benign essential hypertension: No changes made to her medication. Discharge Instructions  Discharge Instructions    Diet - low sodium heart healthy   Complete by: As directed    Increase activity slowly   Complete by: As directed      Allergies as of 04/14/2020      Reactions   Aspirin Hives   Other reaction(s): Other (See Comments) unk   Codeine Hives, Nausea And Vomiting   Lisinopril Swelling   Other reaction(s): Other (See Comments) Headache.   Penicillins Hives   Other  reaction(s): Other (See Comments)   Diphenhydramine Hcl Rash   Moxifloxacin Rash      Medication List    TAKE these medications   cephALEXin 500 MG capsule Commonly known as: KEFLEX Take 1 capsule (500 mg total) by mouth 4 (four) times daily for 10 days.   fluconazole 100 MG tablet Commonly known as: DIFLUCAN Take 1 tablet (100 mg total) by mouth daily for 4 days. Start taking on: April 15, 2020   levothyroxine 100 MCG tablet Commonly known as: SYNTHROID Take 100 mcg by mouth daily.   losartan 50 MG tablet Commonly known as: COZAAR Take 50 mg by mouth daily.   Synjardy XR 12.05-998 MG Tb24 Generic drug: Empagliflozin-metFORMIN HCl ER Take 1 tablet by mouth daily.   tamsulosin 0.4 MG Caps capsule Commonly known as: FLOMAX Take 1 capsule (0.4 mg total) by mouth daily. Start taking on: April 15, 2020       Allergies  Allergen Reactions  . Aspirin Hives    Other reaction(s): Other (See Comments) unk  . Codeine Hives and Nausea And Vomiting  . Lisinopril Swelling    Other reaction(s): Other (See Comments) Headache.  . Penicillins Hives    Other reaction(s): Other (See Comments)  . Diphenhydramine Hcl Rash  . Moxifloxacin Rash    Consultations:  Urology   Procedures/Studies: CT RENAL STONE STUDY  Result Date: 04/11/2020 CLINICAL DATA:  Left upper quadrant flank pain EXAM: CT ABDOMEN AND PELVIS WITHOUT CONTRAST TECHNIQUE: Multidetector CT imaging of the abdomen and pelvis was performed following the standard protocol without IV contrast. COMPARISON:  CT dated August 05, 2014 FINDINGS: Lower chest: There is  scattered pulmonary nodules at the lung bases bilaterally that are consistent with the patient's reported history of sarcoidosis.The heart size is normal. Hepatobiliary: The liver is normal. Normal gallbladder.There is no biliary ductal dilation. Pancreas: Normal contours without ductal dilatation. No peripancreatic fluid collection. Spleen: Unremarkable.  Adrenals/Urinary Tract: --Adrenal glands: Unremarkable. --Right kidney/ureter: No hydronephrosis or radiopaque kidney stones. --Left kidney/ureter: There is fullness of the left renal collecting system. There is fat stranding about the left kidney. There is a questionable stone in the distal left ureter measuring approximately 2 mm (axial series 2, image 64). The ureter at this level is difficult to follow. --Urinary bladder: Unremarkable. Stomach/Bowel: --Stomach/Duodenum: No hiatal hernia or other gastric abnormality. Normal duodenal course and caliber. --Small bowel: Unremarkable. --Colon: Unremarkable. --Appendix: Normal. Vascular/Lymphatic: Normal course and caliber of the major abdominal vessels. --No retroperitoneal lymphadenopathy. --No mesenteric lymphadenopathy. --No pelvic or inguinal lymphadenopathy. Reproductive: Unremarkable Other: No ascites or free air. The abdominal wall is normal. Musculoskeletal. No acute displaced fractures. IMPRESSION: There is fullness of the left renal collecting system with fat stranding about the left kidney. There is a questionable 2 mm stone in the distal left ureter. Correlation with urinalysis is recommended as pyelonephritis is within the differential diagnosis. Electronically Signed   By: Katherine Mantlehristopher  Green M.D.   On: 04/11/2020 19:24   (Echo, Carotid, EGD, Colonoscopy, ERCP)    Subjective: No complaints  Discharge Exam: Vitals:   04/13/20 2022 04/14/20 0539  BP: (!) 120/57 116/65  Pulse: 79 73  Resp: 16 16  Temp: 97.9 F (36.6 C)   SpO2: 99% 100%   Vitals:   04/12/20 2120 04/13/20 0501 04/13/20 2022 04/14/20 0539  BP: (!) 106/50 115/74 (!) 120/57 116/65  Pulse: 72 73 79 73  Resp:  15 16 16   Temp:  98.2 F (36.8 C) 97.9 F (36.6 C)   TempSrc:  Oral Oral   SpO2:  97% 99% 100%  Weight:      Height:        General: Pt is alert, awake, not in acute distress Cardiovascular: RRR, S1/S2 +, no rubs, no gallops Respiratory: CTA bilaterally, no  wheezing, no rhonchi Abdominal: Soft, NT, ND, bowel sounds + Extremities: no edema, no cyanosis    The results of significant diagnostics from this hospitalization (including imaging, microbiology, ancillary and laboratory) are listed below for reference.     Microbiology: Recent Results (from the past 240 hour(s))  Urine culture     Status: Abnormal   Collection Time: 04/11/20  8:08 PM   Specimen: Urine, Random  Result Value Ref Range Status   Specimen Description   Final    URINE, RANDOM Performed at [facility] [place], [address] [place], [place] [address]    Special Requests   Final    NONE Performed at [facility] [place], [address] [place], [place] [address]    Culture 20,000 COLONIES/mL ESCHERICHIA COLI (A)  Final   Report Status 04/14/2020 FINAL  Final   Organism ID, Bacteria ESCHERICHIA COLI (A)  Final      Susceptibility   Escherichia coli - MIC*    AMPICILLIN <=2 SENSITIVE Sensitive     CEFAZOLIN <=4 SENSITIVE Sensitive     CEFEPIME <=0.12 SENSITIVE Sensitive     CEFTRIAXONE <=0.25 SENSITIVE Sensitive     CIPROFLOXACIN <=0.25 SENSITIVE Sensitive     GENTAMICIN <=1 SENSITIVE Sensitive     IMIPENEM <=0.25 SENSITIVE Sensitive     NITROFURANTOIN <=16 SENSITIVE Sensitive  TRIMETH/SULFA <=20 SENSITIVE Sensitive     AMPICILLIN/SULBACTAM <=2 SENSITIVE Sensitive     PIP/TAZO <=4 SENSITIVE Sensitive     * 20,000 COLONIES/mL ESCHERICHIA COLI  Resp Panel by RT-PCR (Flu A&B, Covid) Nasopharyngeal Swab     Status: None   Collection Time: 04/11/20 11:37 PM   Specimen: Nasopharyngeal Swab; Nasopharyngeal(NP) swabs in vial transport medium  Result Value Ref Range Status   SARS Coronavirus 2 by RT PCR NEGATIVE NEGATIVE Final    Comment: (NOTE) SARS-CoV-2 target nucleic acids are NOT DETECTED.  The SARS-CoV-2 RNA is generally detectable in upper respiratory specimens during the acute phase of infection. The lowest concentration of SARS-CoV-2 viral  copies this assay can detect is 138 copies/mL. A negative result does not preclude SARS-Cov-2 infection and should not be used as the sole basis for treatment or other patient management decisions. A negative result may occur with  improper specimen collection/handling, submission of specimen other than nasopharyngeal swab, presence of viral mutation(s) within the areas targeted by this assay, and inadequate number of viral copies(<138 copies/mL). A negative result must be combined with clinical observations, patient history, and epidemiological information. The expected result is Negative.  Fact Sheet for Patients:  BloggerCourse.com  Fact Sheet for Healthcare Providers:  SeriousBroker.it  This test is no t yet approved or cleared by the [place] FDA and  has been authorized for detection and/or diagnosis of SARS-CoV-2 by FDA under an Emergency Use Authorization (EUA). This EUA will remain  in effect (meaning this test can be used) for the duration of the COVID-19 declaration under Section 564(b)(1) of the Act, 21 U.S.C.section 360bbb-3(b)(1), unless the authorization is terminated  or revoked sooner.       Influenza A by PCR NEGATIVE NEGATIVE Final   Influenza B by PCR NEGATIVE NEGATIVE Final    Comment: (NOTE) The Xpert Xpress SARS-CoV-2/FLU/RSV plus assay is intended as an aid in the diagnosis of influenza from Nasopharyngeal swab specimens and should not be used as a sole basis for treatment. Nasal washings and aspirates are unacceptable for Xpert Xpress SARS-CoV-2/FLU/RSV testing.  Fact Sheet for Patients: BloggerCourse.com  Fact Sheet for Healthcare Providers: SeriousBroker.it  This test is not yet approved or cleared by the [place] FDA and has been authorized for detection and/or diagnosis of SARS-CoV-2 by FDA under an Emergency Use Authorization (EUA). This  EUA will remain in effect (meaning this test can be used) for the duration of the COVID-19 declaration under Section 564(b)(1) of the Act, 21 U.S.C. section 360bbb-3(b)(1), unless the authorization is terminated or revoked.  Performed at [facility], [address] [place], [place] [address]   Urine Culture     Status: None   Collection Time: 04/12/20 12:02 PM   Specimen: Urine, Random  Result Value Ref Range Status   Specimen Description   Final    URINE, RANDOM Performed at [facility], [address] [place], [place] [address]    Special Requests   Final    NONE Performed at [facility], [address], [place], [place] [address]    Culture   Final    NO GROWTH Performed at [facility], [address] [place], [place] [address]    Report Status 04/13/2020 FINAL  Final     Labs: BNP (last 3 results) No results for input(s): BNP in the last 8760 hours. Basic Metabolic Panel: Recent Labs  Lab 04/11/20 2008  NA 132*  K  3.8  CL 100  CO2 20*  GLUCOSE 169*  BUN 16  CREATININE 0.70  CALCIUM 8.9   Liver Function Tests: No results for input(s): AST, ALT, ALKPHOS, BILITOT, PROT, ALBUMIN in the last 168 hours. No results for input(s): LIPASE, AMYLASE in the last 168 hours. No results for input(s): AMMONIA in the last 168 hours. CBC: Recent Labs  Lab 04/11/20 2008  WBC 10.0  HGB 13.7  HCT 41.5  MCV 89.1  PLT 288   Cardiac Enzymes: No results for input(s): CKTOTAL, CKMB, CKMBINDEX, TROPONINI in the last 168 hours. BNP: Invalid input(s): POCBNP CBG: Recent Labs  Lab 04/13/20 0735 04/13/20 1137 04/13/20 1638 04/13/20 2020 04/14/20 0714  GLUCAP 126* 177* 178* 222* 133*   D-Dimer No results for input(s): DDIMER in the last 72 hours. Hgb A1c Recent Labs    04/13/20 0421  HGBA1C 7.4*   Lipid Profile No results for input(s): CHOL, HDL, LDLCALC, TRIG, CHOLHDL, LDLDIRECT in the last 72  hours. Thyroid function studies No results for input(s): TSH, T4TOTAL, T3FREE, THYROIDAB in the last 72 hours.  Invalid input(s): FREET3 Anemia work up No results for input(s): VITAMINB12, FOLATE, FERRITIN, TIBC, IRON, RETICCTPCT in the last 72 hours. Urinalysis    Component Value Date/Time   COLORURINE YELLOW 04/11/2020 2008   APPEARANCEUR CLOUDY (A) 04/11/2020 2008   LABSPEC 1.015 04/11/2020 2008   PHURINE 5.5 04/11/2020 2008   GLUCOSEU >=500 (A) 04/11/2020 2008   HGBUR LARGE (A) 04/11/2020 2008   BILIRUBINUR NEGATIVE 04/11/2020 2008   KETONESUR 40 (A) 04/11/2020 2008   PROTEINUR 30 (A) 04/11/2020 2008   NITRITE POSITIVE (A) 04/11/2020 2008   LEUKOCYTESUR MODERATE (A) 04/11/2020 2008   Sepsis Labs Invalid input(s): PROCALCITONIN,  WBC,  LACTICIDVEN Microbiology Recent Results (from the past 240 hour(s))  Urine culture     Status: Abnormal   Collection Time: 04/11/20  8:08 PM   Specimen: Urine, Random  Result Value Ref Range Status   Specimen Description   Final    URINE, RANDOM Performed at [facility], [address] [place], [place] [address]    Special Requests   Final    NONE Performed at [facility], [address] [place], [place] [address]    Culture 20,000 COLONIES/mL ESCHERICHIA COLI (A)  Final   Report Status 04/14/2020 FINAL  Final   Organism ID, Bacteria ESCHERICHIA COLI (A)  Final      Susceptibility   Escherichia coli - MIC*    AMPICILLIN <=2 SENSITIVE Sensitive     CEFAZOLIN <=4 SENSITIVE Sensitive     CEFEPIME <=0.12 SENSITIVE Sensitive     CEFTRIAXONE <=0.25 SENSITIVE Sensitive     CIPROFLOXACIN <=0.25 SENSITIVE Sensitive     GENTAMICIN <=1 SENSITIVE Sensitive     IMIPENEM <=0.25 SENSITIVE Sensitive     NITROFURANTOIN <=16 SENSITIVE Sensitive     TRIMETH/SULFA <=20 SENSITIVE Sensitive     AMPICILLIN/SULBACTAM <=2 SENSITIVE Sensitive     PIP/TAZO <=4 SENSITIVE Sensitive     * 20,000 COLONIES/mL ESCHERICHIA COLI  Resp  Panel by RT-PCR (Flu A&B, Covid) Nasopharyngeal Swab     Status: None   Collection Time: 04/11/20 11:37 PM   Specimen: Nasopharyngeal Swab; Nasopharyngeal(NP) swabs in vial transport medium  Result Value Ref Range Status   SARS Coronavirus 2 by RT PCR NEGATIVE NEGATIVE Final    Comment: (NOTE) SARS-CoV-2 target nucleic acids are NOT DETECTED.  The SARS-CoV-2 RNA is generally detectable in upper respiratory specimens during  the acute phase of infection. The lowest concentration of SARS-CoV-2 viral copies this assay can detect is 138 copies/mL. A negative result does not preclude SARS-Cov-2 infection and should not be used as the sole basis for treatment or other patient management decisions. A negative result may occur with  improper specimen collection/handling, submission of specimen other than nasopharyngeal swab, presence of viral mutation(s) within the areas targeted by this assay, and inadequate number of viral copies(<138 copies/mL). A negative result must be combined with clinical observations, patient history, and epidemiological information. The expected result is Negative.  Fact Sheet for Patients:  BloggerCourse.com  Fact Sheet for Healthcare Providers:  SeriousBroker.it  This test is no t yet approved or cleared by the [place] FDA and  has been authorized for detection and/or diagnosis of SARS-CoV-2 by FDA under an Emergency Use Authorization (EUA). This EUA will remain  in effect (meaning this test can be used) for the duration of the COVID-19 declaration under Section 564(b)(1) of the Act, 21 U.S.C.section 360bbb-3(b)(1), unless the authorization is terminated  or revoked sooner.       Influenza A by PCR NEGATIVE NEGATIVE Final   Influenza B by PCR NEGATIVE NEGATIVE Final    Comment: (NOTE) The Xpert Xpress SARS-CoV-2/FLU/RSV plus assay is intended as an aid in the diagnosis of influenza from Nasopharyngeal  swab specimens and should not be used as a sole basis for treatment. Nasal washings and aspirates are unacceptable for Xpert Xpress SARS-CoV-2/FLU/RSV testing.  Fact Sheet for Patients: BloggerCourse.com  Fact Sheet for Healthcare Providers: SeriousBroker.it  This test is not yet approved or cleared by the [place] FDA and has been authorized for detection and/or diagnosis of SARS-CoV-2 by FDA under an Emergency Use Authorization (EUA). This EUA will remain in effect (meaning this test can be used) for the duration of the COVID-19 declaration under Section 564(b)(1) of the Act, 21 U.S.C. section 360bbb-3(b)(1), unless the authorization is terminated or revoked.  Performed at [facility], [address] [place], [place] [address]   Urine Culture     Status: None   Collection Time: 04/12/20 12:02 PM   Specimen: Urine, Random  Result Value Ref Range Status   Specimen Description   Final    URINE, RANDOM Performed at [facility], [address] [place], [place] [address]    Special Requests   Final    NONE Performed at [facility], [address], [place], [place] [address]    Culture   Final    NO GROWTH Performed at [facility], [address], [place], [place] [address]    Report Status 04/13/2020 FINAL  Final     Time coordinating discharge: Over 30 minutes  SIGNED:   Marinda Elk, MD  [facility] 04/14/2020, 9:15 AM Pager   If 7PM-7AM, please contact night-coverage [email or web address] Password [email or web address]

## 2020-04-14 NOTE — Discharge Instructions (Signed)
[name] was admitted to the Hospital on 04/11/2020 and Discharged on Discharge Date 04/14/2020 and should be excused from work/school   for 3   days starting 04/11/2020 , may return to work/school without any restrictions.  Call Lambert Keto MD, [facility] [phone] with questions.  Marinda Elk M.D on 04/14/2020,at 9:13 AM  [facility] Office  [phone]

## 2020-04-14 NOTE — Progress Notes (Signed)
Discharge instructions discussed with patient, verbalized agreement and understanding 

## 2020-08-22 ENCOUNTER — Other Ambulatory Visit: Payer: Self-pay

## 2020-08-22 ENCOUNTER — Emergency Department (HOSPITAL_BASED_OUTPATIENT_CLINIC_OR_DEPARTMENT_OTHER): Payer: BC Managed Care – PPO

## 2020-08-22 ENCOUNTER — Encounter (HOSPITAL_BASED_OUTPATIENT_CLINIC_OR_DEPARTMENT_OTHER): Payer: Self-pay

## 2020-08-22 ENCOUNTER — Emergency Department (HOSPITAL_BASED_OUTPATIENT_CLINIC_OR_DEPARTMENT_OTHER)
Admission: EM | Admit: 2020-08-22 | Discharge: 2020-08-22 | Disposition: A | Discharge status: Home / Self Care | Payer: BC Managed Care – PPO | Attending: Emergency Medicine | Admitting: Emergency Medicine

## 2020-08-22 DIAGNOSIS — E039 Hypothyroidism, unspecified: Secondary | ICD-10-CM | POA: Insufficient documentation

## 2020-08-22 DIAGNOSIS — I10 Essential (primary) hypertension: Secondary | ICD-10-CM | POA: Diagnosis not present

## 2020-08-22 DIAGNOSIS — Z79899 Other long term (current) drug therapy: Secondary | ICD-10-CM | POA: Diagnosis not present

## 2020-08-22 DIAGNOSIS — S80212A Abrasion, left knee, initial encounter: Secondary | ICD-10-CM | POA: Diagnosis not present

## 2020-08-22 DIAGNOSIS — E119 Type 2 diabetes mellitus without complications: Secondary | ICD-10-CM | POA: Diagnosis not present

## 2020-08-22 DIAGNOSIS — J45909 Unspecified asthma, uncomplicated: Secondary | ICD-10-CM | POA: Diagnosis not present

## 2020-08-22 DIAGNOSIS — W010XXA Fall on same level from slipping, tripping and stumbling without subsequent striking against object, initial encounter: Secondary | ICD-10-CM | POA: Insufficient documentation

## 2020-08-22 DIAGNOSIS — R519 Headache, unspecified: Secondary | ICD-10-CM | POA: Insufficient documentation

## 2020-08-22 DIAGNOSIS — S80211A Abrasion, right knee, initial encounter: Secondary | ICD-10-CM | POA: Diagnosis not present

## 2020-08-22 DIAGNOSIS — W19XXXA Unspecified fall, initial encounter: Secondary | ICD-10-CM

## 2020-08-22 MED ORDER — ACETAMINOPHEN 500 MG PO TABS: 500.0000 mg | ORAL_TABLET | Freq: Four times a day (QID) | ORAL | Status: DC | PRN

## 2020-08-22 MED ORDER — ACETAMINOPHEN 325 MG PO TABS
650.0000 mg | ORAL_TABLET | Freq: Once | ORAL | Status: AC
  Administered 2020-08-22: 650 mg via ORAL
  Filled 2020-08-22: qty 2

## 2020-08-22 MED ORDER — ACETAMINOPHEN 500 MG PO TABS: 500.0000 mg | ORAL_TABLET | Freq: Four times a day (QID) | ORAL | 0 refills | Status: AC | PRN

## 2020-08-22 MED ORDER — ACETAMINOPHEN 325 MG PO TABS: 650.0000 mg | ORAL_TABLET | Freq: Four times a day (QID) | ORAL | Status: DC | PRN

## 2020-08-22 NOTE — ED Triage Notes (Signed)
Pt states she tripped/fell ~1045-c/o pain to left knee, forehead area and upper lip-denies LOC-no break in skin noted to head or lip-NAD-steady gait

## 2020-08-22 NOTE — ED Provider Notes (Signed)
[facility] EMERGENCY DEPARTMENT Provider Note   CSN: [id number] Arrival date & time: 08/22/20  1319     History Chief Complaint  Patient presents with   Fall    [name] is a 55 y.o. female who is presenting after a fall that occurred this morning around 10 AM.  Patient reports that she was walking outside on some stairs and slipped and fell hitting her head and mouth on the concrete.  Reports scraping of her knees and intense left knee pain as well.  Denies loss of consciousness but endorses dizziness and some nausea.  Could not get up and was helped up by a neighbor.  Says that she took some Advil and went to her orthopedic doctor as planned for her shoulder injury.  Orthopedic MD stated that she did not look good and should go to the emergency department.  She tried to go to work however started to feel worse and came to the ED. denies chest pain or shortness of breath.  Endorses headache and photophobia.  Says that her head hurts on the right forehead and temporal skull.  Also has pain to her cervical spine at the levels of to C3.  No loss of bowel or bladder control.  Reports hitting her mouth and cutting her inner lips with her braces, but no loose teeth.  Minor bleeding reported.  Fall Associated symptoms include headaches. Pertinent negatives include no chest pain, no abdominal pain and no shortness of breath.      Past Medical History:  Diagnosis Date   Allergic reaction    Diabetes mellitus    Hypertension    Hypothyroidism    Sarcoidosis     Patient Active Problem List   Diagnosis Date Noted   DM type 2 (diabetes mellitus, type 2) (HCC) 04/12/2020   Hypothyroidism 04/12/2020   Benign essential HTN 04/12/2020   Acute pyelonephritis 04/11/2020   Biceps tendinopathy 02/09/2019   Tendinosis of rotator cuff 07/30/2018   ASTHMA 10/28/2006   HYPERSOMNIA 10/28/2006    Past Surgical History:  Procedure Laterality Date   KNEE ARTHROSCOPY Left     ROTATOR CUFF REPAIR Left    THYROIDECTOMY     TONSILLECTOMY     TUBAL LIGATION  1990   uterine ablation  2008     OB History   No obstetric history on file.     Family History  Problem Relation Age of Onset   Diabetes Mother    Cancer Brother        sarcoma    Social History   Tobacco Use   Smoking status: Never   Smokeless tobacco: Never  Vaping Use   Vaping Use: Never used  Substance Use Topics   Alcohol use: No   Drug use: No    Home Medications Prior to Admission medications   Medication Sig Start Date End Date Taking? Authorizing Provider  Empagliflozin-metFORMIN HCl ER (SYNJARDY XR) 12.05-998 MG TB24 Take 1 tablet by mouth daily. 01/11/20   [provider]  levothyroxine (SYNTHROID, LEVOTHROID) 100 MCG tablet Take 100 mcg by mouth daily.    [provider]  losartan (COZAAR) 50 MG tablet Take 50 mg by mouth daily.    [provider]  tamsulosin (FLOMAX) 0.4 MG CAPS capsule Take 1 capsule (0.4 mg total) by mouth daily. 04/15/20   Marinda ElkFeliz Ortiz, Abraham, MD  simvastatin (ZOCOR) 10 MG tablet Take 10 mg by mouth at bedtime.    12/09/12  [provider]  sitaGLIPtin (JANUVIA) 25 MG tablet Take 25 mg by mouth daily.    12/09/12  [provider]    Allergies    Aspirin, Codeine, Lisinopril, Penicillins, Diphenhydramine hcl, and Moxifloxacin  Review of Systems   Review of Systems  Constitutional:  Negative for chills and fever.  HENT:  Negative for ear pain and sore throat.   Eyes:  Negative for pain and visual disturbance.  Respiratory:  Negative for cough, chest tightness and shortness of breath.   Cardiovascular:  Negative for chest pain and palpitations.  Gastrointestinal:  Positive for nausea. Negative for abdominal pain and vomiting.  Genitourinary:  Positive for enuresis. Negative for difficulty urinating and urgency.  Musculoskeletal:  Positive for arthralgias and neck pain. Negative for back pain, joint swelling and  neck stiffness.  Skin:  Negative for color change and rash.  Neurological:  Positive for headaches. Negative for seizures, syncope, weakness, light-headedness and numbness.  Psychiatric/Behavioral:  The patient is nervous/anxious.   All other systems reviewed and are negative.  Physical Exam Updated Vital Signs BP 127/74 (BP Location: Left Arm)   Pulse 89   Temp 98.9 F (37.2 C) (Oral)   Resp 18   Ht 5\' 4"  (1.626 m)   Wt 67.1 kg   SpO2 100%   BMI 25.40 kg/m   Physical Exam Vitals and nursing note reviewed.  Constitutional:      Appearance: Normal appearance.  HENT:     Head: Normocephalic and atraumatic.     Right Ear: Tympanic membrane and ear canal normal.     Left Ear: Tympanic membrane and ear canal normal.     Nose: Nose normal.     Mouth/Throat:     Mouth: Mucous membranes are moist.     Pharynx: Oropharynx is clear.     Comments: Small abrasion to the upper and lower inner lips at the level of her front teeth.  No loose or chipped teeth.  No blood noted Eyes:     General: No scleral icterus.    Extraocular Movements: Extraocular movements intact.     Conjunctiva/sclera: Conjunctivae normal.     Pupils: Pupils are equal, round, and reactive to light.  Cardiovascular:     Rate and Rhythm: Normal rate and regular rhythm.  Pulmonary:     Effort: Pulmonary effort is normal. No respiratory distress.  Abdominal:     General: Abdomen is flat. There is no distension.     Palpations: Abdomen is soft.     Tenderness: There is no abdominal tenderness. There is no guarding.  Musculoskeletal:        General: Tenderness (Left knee tender to palpation just below the patella.  No tenderness over patella or proximal tibia) present. No swelling. Normal range of motion.     Cervical back: Tenderness (To palpation of C5-6 midline) present.  Skin:    General: Skin is warm and dry.     Capillary Refill: Capillary refill takes less than 2 seconds.     Findings: Lesion (Small  abrasions noted to bilateral knees) present. No bruising.  Neurological:     General: No focal deficit present.     Mental Status: She is alert.     Cranial Nerves: No cranial nerve deficit (Cranial nerves II through XII assessed no deficits).     Sensory: No sensory deficit.     Motor: No weakness.     Coordination: Coordination normal.     Gait: Gait normal.  Psychiatric:  Mood and Affect: Mood normal.    ED Results / Procedures / Treatments   Labs (all labs ordered are listed, but only abnormal results are displayed) Labs Reviewed - No data to display  EKG None  Radiology CT Head Wo Contrast  Result Date: 08/22/2020 CLINICAL DATA:  Recent trip and fall with headaches and neck pain, initial encounter EXAM: CT HEAD WITHOUT CONTRAST CT CERVICAL SPINE WITHOUT CONTRAST TECHNIQUE: Multidetector CT imaging of the head and cervical spine was performed following the standard protocol without intravenous contrast. Multiplanar CT image reconstructions of the cervical spine were also generated. COMPARISON:  None. FINDINGS: CT HEAD FINDINGS Brain: No evidence of acute infarction, hemorrhage, hydrocephalus, extra-axial collection or mass lesion/mass effect. Vascular: No hyperdense vessel or unexpected calcification. Skull: Normal. Negative for fracture or focal lesion. Sinuses/Orbits: No acute finding. Other: None. CT CERVICAL SPINE FINDINGS Alignment: Mild straightening of the normal cervical lordosis is noted likely related to muscular spasm. Skull base and vertebrae: 7 cervical segments are well visualized. Vertebral body height is well maintained. No anterolisthesis is seen. No acute fracture or acute facet abnormality is noted. Very mild osteophytic changes are seen. Soft tissues and spinal canal: Changes consistent with prior thyroidectomy are noted. Surrounding soft tissue structures are otherwise within normal limits. Upper chest: Visualized lung apices are unremarkable. Other: None  IMPRESSION: CT of the head: No acute intracranial abnormality noted. CT of the cervical spine: Mild straightening of the normal cervical lordosis. Mild degenerative change without acute abnormality. Electronically Signed   By: Alcide Clever M.D.   On: 08/22/2020 17:06   CT Cervical Spine Wo Contrast  Result Date: 08/22/2020 CLINICAL DATA:  Recent trip and fall with headaches and neck pain, initial encounter EXAM: CT HEAD WITHOUT CONTRAST CT CERVICAL SPINE WITHOUT CONTRAST TECHNIQUE: Multidetector CT imaging of the head and cervical spine was performed following the standard protocol without intravenous contrast. Multiplanar CT image reconstructions of the cervical spine were also generated. COMPARISON:  None. FINDINGS: CT HEAD FINDINGS Brain: No evidence of acute infarction, hemorrhage, hydrocephalus, extra-axial collection or mass lesion/mass effect. Vascular: No hyperdense vessel or unexpected calcification. Skull: Normal. Negative for fracture or focal lesion. Sinuses/Orbits: No acute finding. Other: None. CT CERVICAL SPINE FINDINGS Alignment: Mild straightening of the normal cervical lordosis is noted likely related to muscular spasm. Skull base and vertebrae: 7 cervical segments are well visualized. Vertebral body height is well maintained. No anterolisthesis is seen. No acute fracture or acute facet abnormality is noted. Very mild osteophytic changes are seen. Soft tissues and spinal canal: Changes consistent with prior thyroidectomy are noted. Surrounding soft tissue structures are otherwise within normal limits. Upper chest: Visualized lung apices are unremarkable. Other: None IMPRESSION: CT of the head: No acute intracranial abnormality noted. CT of the cervical spine: Mild straightening of the normal cervical lordosis. Mild degenerative change without acute abnormality. Electronically Signed   By: Alcide Clever M.D.   On: 08/22/2020 17:06   DG Knee Complete 4 Views Left  Result Date:  08/22/2020 CLINICAL DATA:  Trip and fall with left knee pain, initial encounter EXAM: LEFT KNEE - COMPLETE 4+ VIEW COMPARISON:  None. FINDINGS: No evidence of fracture, dislocation, or joint effusion. No evidence of arthropathy or other focal bone abnormality. Soft tissues are unremarkable. IMPRESSION: No acute abnormality noted. Electronically Signed   By: Alcide Clever M.D.   On: 08/22/2020 17:03    Procedures Procedures   Medications Ordered in ED Medications  acetaminophen (TYLENOL) tablet 650 mg (has no  administration in time range)    ED Course  I have reviewed the triage vital signs and the nursing notes.  Pertinent labs & imaging results that were available during my care of the patient were reviewed by me and considered in my medical decision making (see chart for details).  Clinical Course as of 08/22/20 1721  Mon Aug 22, 2020  1711 DG Knee Complete 4 Views Left [MR]    Clinical Course User Index [MR] Romani Wilbon, Konrad Felix   MDM Rules/Calculators/A&P                         Patient is a 55 year old female who presented after the fall this morning.  She states that initially she tried to continue her day but was having difficulties looking at the computer and her phone so she decided to come to the emergency department. Because she complained of dizziness, photophobia, visual disturbance, nausea, headache I decided to get a CT of her head.  Additionally she was complaining of some cervical neck pain which prompted me to obtain a CT cervical neck.  She had significant left knee tenderness so I obtained a radiograph.  All imaging was without acute abnormalities.  I suspect the patient is just sore and will continue to be uncomfortable as she heals over the next few days.  Low suspicion for an intracranial hemorrhage due to normal neurological exam and negative CT.  Patient understanding of alarming symptoms that suggests a delayed hemorrhage.  She says that her ibuprofen worked  somewhat but not very well.  I will discharge the patient with Tylenol to continue to treat her pain.  We discussed the potential for concussion and she has been counseled on concussion protocols.  She reports understanding of red flag symptoms and will return as needed as discussed.  Patient's vital signs remained stable and she continued her present in no acute distress.  I believe her to be safe for discharge with close monitoring of her symptoms at home.  She will continue with her orthopedic visits for her shoulder as planned. Agreable to discharge   Final Clinical Impression(s) / ED Diagnoses Final diagnoses:  Fall, initial encounter    Rx / DC Orders Results and diagnoses were explained to the patient. Return precautions discussed in full. Patient had no additional questions and expressed complete understanding.     [name] 08/22/20 1740    Vanetta Mulders, MD 08/26/20 2256

## 2021-10-23 IMAGING — DX DG FOREARM 2V*L*
2 series · 2 of 2 positions shown · non-contrast
Comparison: None.

CLINICAL DATA: Left shoulder pain and swelling to proximal humerus,
NKI, cortisone injection 3 months ago with some relief, painful and
very limited ROM, pt unable to tolerated positioning for axial view
of shoulder Pt also complains of left mi.*comment was truncated*

EXAM:
LEFT FOREARM - 2 VIEW

[forearm ap]
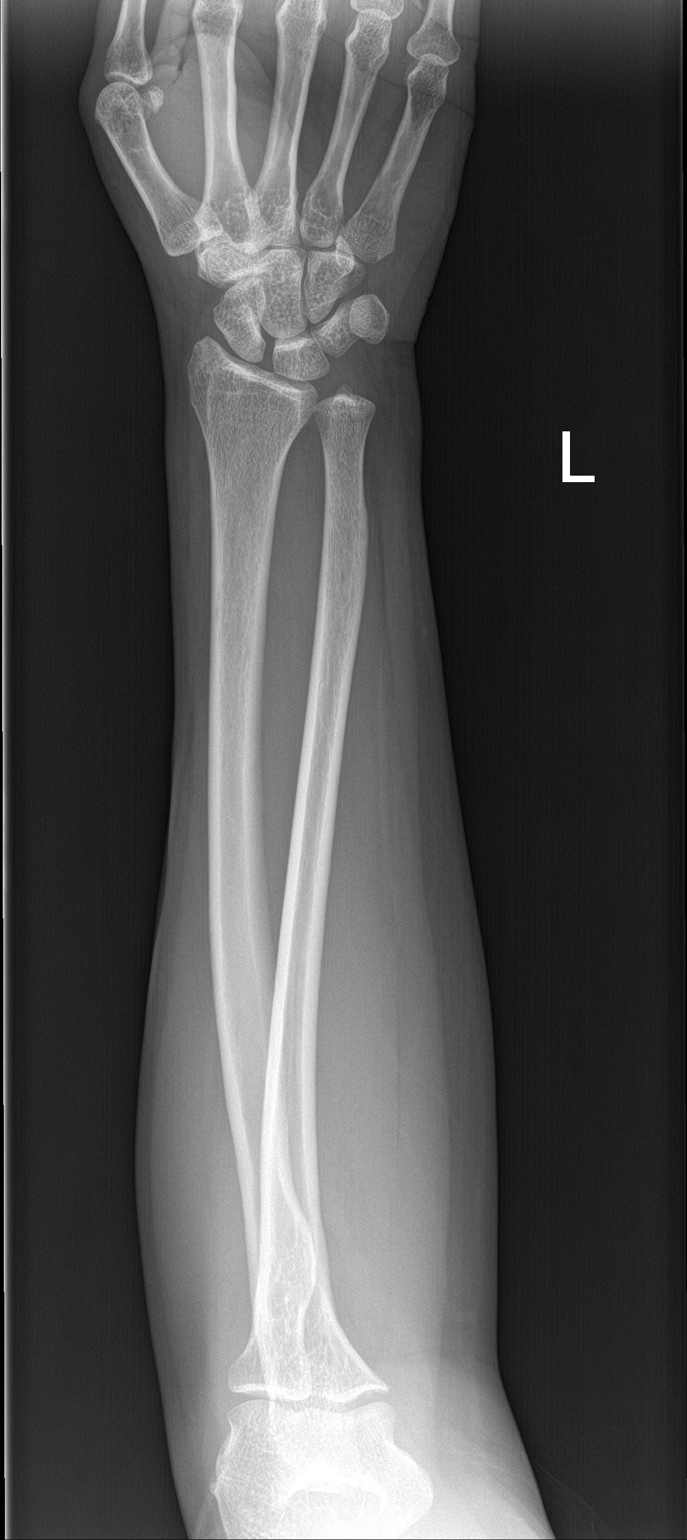

[forearm lat]
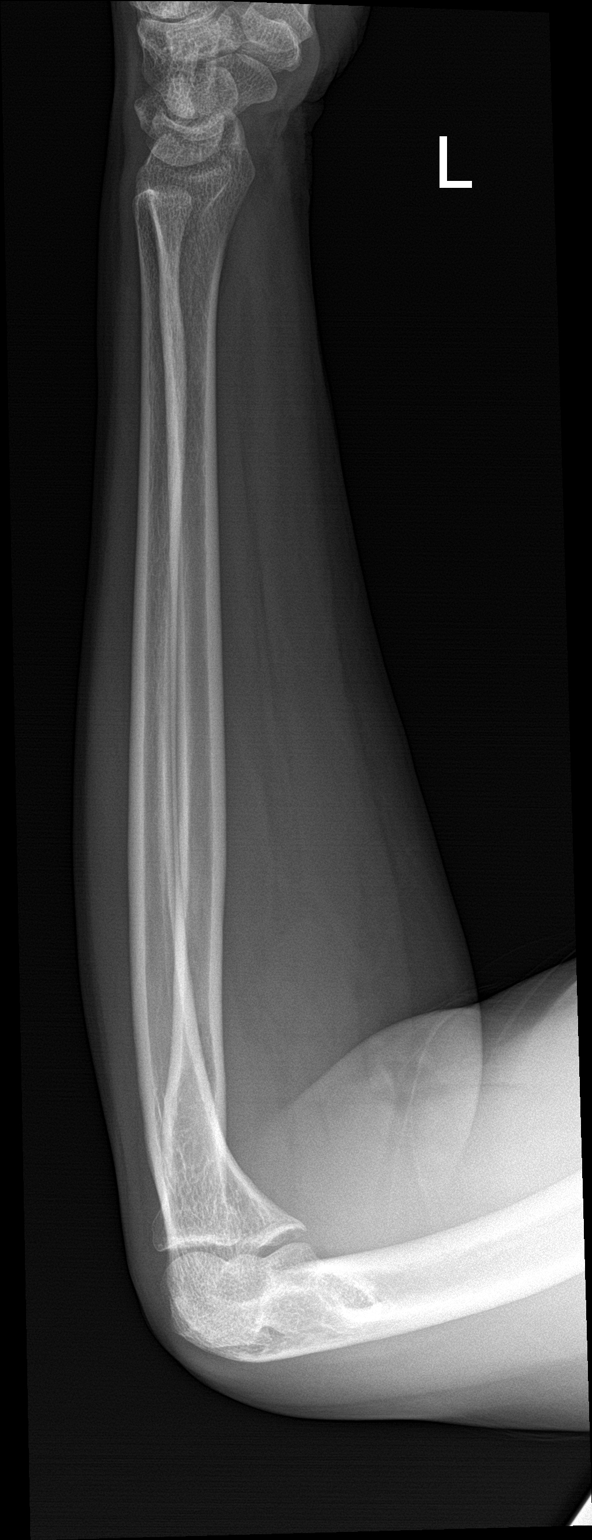

[2 of 2 positions shown; findings below may reference images not displayed]

FINDINGS: There is no evidence of fracture or other focal bone lesions. Soft
tissues are unremarkable.
IMPRESSION: Negative left forearm radiographs.

## 2022-04-24 ENCOUNTER — Encounter: Payer: Self-pay | Admitting: *Deleted

## 2022-09-23 ENCOUNTER — Emergency Department (HOSPITAL_BASED_OUTPATIENT_CLINIC_OR_DEPARTMENT_OTHER): Payer: BC Managed Care – PPO

## 2022-09-23 ENCOUNTER — Emergency Department (HOSPITAL_BASED_OUTPATIENT_CLINIC_OR_DEPARTMENT_OTHER)
Admission: EM | Admit: 2022-09-23 | Discharge: 2022-09-23 | Disposition: A | Discharge status: Home / Self Care | Payer: BC Managed Care – PPO | Attending: Emergency Medicine | Admitting: Emergency Medicine

## 2022-09-23 ENCOUNTER — Encounter (HOSPITAL_BASED_OUTPATIENT_CLINIC_OR_DEPARTMENT_OTHER): Payer: Self-pay | Admitting: Urology

## 2022-09-23 ENCOUNTER — Other Ambulatory Visit: Payer: Self-pay

## 2022-09-23 DIAGNOSIS — Z7984 Long term (current) use of oral hypoglycemic drugs: Secondary | ICD-10-CM | POA: Insufficient documentation

## 2022-09-23 DIAGNOSIS — S322XXA Fracture of coccyx, initial encounter for closed fracture: Secondary | ICD-10-CM | POA: Insufficient documentation

## 2022-09-23 DIAGNOSIS — S93505A Unspecified sprain of left lesser toe(s), initial encounter: Secondary | ICD-10-CM | POA: Insufficient documentation

## 2022-09-23 DIAGNOSIS — W108XXA Fall (on) (from) other stairs and steps, initial encounter: Secondary | ICD-10-CM | POA: Diagnosis not present

## 2022-09-23 DIAGNOSIS — S3992XA Unspecified injury of lower back, initial encounter: Secondary | ICD-10-CM | POA: Diagnosis present

## 2022-09-23 MED ORDER — LIDOCAINE 5 % EX PTCH
1.0000 | MEDICATED_PATCH | CUTANEOUS | Status: DC
  Administered 2022-09-23: 1 via TRANSDERMAL
  Filled 2022-09-23: qty 1

## 2022-09-23 MED ORDER — KETOROLAC TROMETHAMINE 15 MG/ML IJ SOLN
15.0000 mg | Freq: Once | INTRAMUSCULAR | Status: AC
  Administered 2022-09-23: 15 mg via INTRAMUSCULAR
  Filled 2022-09-23: qty 1

## 2022-09-23 MED ORDER — LIDOCAINE 5 % EX PTCH: 1.0000 | MEDICATED_PATCH | CUTANEOUS | 0 refills | Status: AC

## 2022-09-23 MED ORDER — MELOXICAM 7.5 MG PO TABS
7.5000 mg | ORAL_TABLET | Freq: Every day | ORAL | 0 refills | Status: AC
Start: 2022-09-23 — End: ?

## 2022-09-23 NOTE — ED Triage Notes (Signed)
Pt states injury to left pinky toe last Thursday, was guarding it yesterday and fell down 6 steps, states coccyx pain from fall and repots headache, unknown of any head injury but does deny LOC  Pain with sitting and tender to touch

## 2022-09-23 NOTE — Discharge Instructions (Signed)
Please read and follow all provided instructions.  Your diagnoses today include:  1. Fracture of coccyx, initial encounter for closed fracture (HCC)   2. Sprain of fifth toe of left foot, initial encounter     Tests performed today include: An x-ray of the affected area - no broken toe, possible tailbone fracture Vital signs. See below for your results today.   Medications prescribed:  Meloxicam - anti-inflammatory pain medication  You have been prescribed an anti-inflammatory medication or NSAID. Take with food. Do not take aspirin, ibuprofen, or naproxen if taking this medication. Take smallest effective dose for the shortest duration needed for your pain. Stop taking if you experience stomach pain or vomiting.   Lidoderm patch  Take any prescribed medications only as directed.  Home care instructions:  Follow any educational materials contained in this packet Follow R.I.C.E. Protocol: R - rest your injury  I  - use ice on injury without applying directly to skin C - compress injury with bandage or splint E - elevate the injury as much as possible  Follow-up instructions: Please follow-up with your primary care provider if you continue to have significant pain in 1 week. In this case you may have a more severe injury that requires further care.   Return instructions:  Please return if your toes or feet are numb or tingling, appear gray or blue, or you have severe pain (also elevate the leg and loosen splint or wrap if you were given one) Please return to the Emergency Department if you experience worsening symptoms.  Please return if you have any other emergent concerns.  Additional Information:  Your vital signs today were: BP (!) 143/80   Pulse 79   Temp 98.5 F (36.9 C) (Oral)   Resp 18   Ht 5\' 4"  (1.626 m)   Wt 67.1 kg   SpO2 100%   BMI 25.39 kg/m  If your blood pressure (BP) was elevated above 135/85 this visit, please have this repeated by your doctor within  one month. --------------

## 2022-09-23 NOTE — ED Provider Notes (Signed)
[place] EMERGENCY DEPARTMENT AT [facility] Provider Note   CSN: [id number] Arrival date & time: 09/23/22  1031     History  Chief Complaint  Patient presents with   Fall    [name] is a 57 y.o. female.  Patient presents to the emergency department for evaluation of left foot pain and lower back pain.  Patient states that she fell on some steps yesterday.  She fell onto her buttocks and hit the left side of her foot.  She has had pain in her sacral area and tailbone area and has had difficulty sitting.  She also has pain in her left little toe and lateral foot.  She states that she has taken ibuprofen without improvement.  She is not sure if she hit her head and she does have headache.  No confusion, vision change or vomiting.       Home Medications Prior to Admission medications   Medication Sig Start Date End Date Taking? Authorizing Provider  Empagliflozin-metFORMIN HCl ER (SYNJARDY XR) 12.05-998 MG TB24 Take 1 tablet by mouth daily. 01/11/20   [provider]  levothyroxine (SYNTHROID, LEVOTHROID) 100 MCG tablet Take 100 mcg by mouth daily.    [provider]  losartan (COZAAR) 50 MG tablet Take 50 mg by mouth daily.    [provider]  tamsulosin (FLOMAX) 0.4 MG CAPS capsule Take 1 capsule (0.4 mg total) by mouth daily. 04/15/20   Marinda Elk, MD  simvastatin (ZOCOR) 10 MG tablet Take 10 mg by mouth at bedtime.    12/09/12  [provider]  sitaGLIPtin (JANUVIA) 25 MG tablet Take 25 mg by mouth daily.    12/09/12  [provider]      Allergies    Aspirin, Codeine, Lisinopril, Penicillins, Diphenhydramine hcl, and Moxifloxacin    Review of Systems   Review of Systems  Physical Exam Updated Vital Signs BP (!) 143/80 (BP Location: Right Arm)   Pulse 73   Temp 98.5 F (36.9 C) (Oral)   Resp 18   Ht 5\' 4"  (1.626 m)   Wt 67.1 kg   SpO2 100%   BMI 25.39 kg/m  Physical Exam Vitals and nursing  note reviewed.  Constitutional:      Appearance: She is well-developed.  HENT:     Head: Normocephalic and atraumatic.  Eyes:     Pupils: Pupils are equal, round, and reactive to light.  Cardiovascular:     Pulses: Normal pulses. No decreased pulses.  Musculoskeletal:        General: Tenderness present.     Cervical back: Normal range of motion and neck supple. No tenderness. Normal range of motion.     Thoracic back: No tenderness. Normal range of motion.     Lumbar back: Tenderness and bony tenderness present. Normal range of motion.       Back:     Left ankle: No tenderness. Normal range of motion.     Left foot: Decreased range of motion. Tenderness present. No deformity or bony tenderness.     Comments: Tenderness over the area of the lateral toe and fifth metatarsal, no bruising or swelling  Skin:    General: Skin is warm and dry.  Neurological:     Mental Status: She is alert.     Sensory: No sensory deficit.     Comments: Motor, sensation, and vascular distal to the injury is fully intact.   Psychiatric:  Mood and Affect: Mood normal.     ED Results / Procedures / Treatments   Labs (all labs ordered are listed, but only abnormal results are displayed) Labs Reviewed - No data to display  EKG None  Radiology DG Sacrum/Coccyx  Result Date: 09/23/2022 CLINICAL DATA:  Status post fall. Complains of coccyx pain after fall. EXAM: SACRUM AND COCCYX - 2+ VIEW COMPARISON:  None FINDINGS: Focal cortical discontinuity involving the mid coccyx noted which may reflect underlying fracture. No additional signs of acute fracture or dislocation. IMPRESSION: Possible fracture involving the mid coccyx. Correlate for any focal tenderness over this area. Electronically Signed   By: Signa Kell M.D.   On: 09/23/2022 11:26   DG Foot Complete Left  Result Date: 09/23/2022 CLINICAL DATA:  Status post fall. Left lateral foot pain. Patient reports injury to left pinky toe last  Thursday. EXAM: LEFT FOOT - COMPLETE 3+ VIEW COMPARISON:  None Available. FINDINGS: There is no evidence of fracture or dislocation. Mild second through fifth hammertoe deformities. Small posterior calcaneal heel spur. Soft tissues are unremarkable. IMPRESSION: 1. No acute findings. 2. Mild second through fifth hammertoe deformities. Electronically Signed   By: Signa Kell M.D.   On: 09/23/2022 11:23    Procedures Procedures    Medications Ordered in ED Medications - No data to display  ED Course/ Medical Decision Making/ A&P    Patient seen and examined. History obtained directly from patient.   Labs/EKG: None ordered  Imaging: Ordered x-ray of the left foot and sacral area  Medications/Fluids: None ordered  Most recent vital signs reviewed and are as follows: BP (!) 143/80 (BP Location: Right Arm)   Pulse 73   Temp 98.5 F (36.9 C) (Oral)   Resp 18   Ht 5\' 4"  (1.626 m)   Wt 67.1 kg   SpO2 100%   BMI 25.39 kg/m   Initial impression: Left fifth toe pain and tailbone area pain.  Headache but low concern for clinically significant head injury as [place] head CT bruises are negative.  I think it would be reasonable to get an x-ray of her left foot to ensure no metatarsal fracture.  We discussed limitations sacral and coccygeal imaging and that would not likely change our treatment plan however patient would like to proceed to determine if it is fractured.  11:58 AM Reassessment performed. Patient appears stable.  Will treat here with IM Toradol and Lidoderm patch.  Imaging personally visualized and interpreted including: Sacral and foot x-rays.  Agree no toe fracture.  Agree possible tailbone fracture.  Reviewed pertinent lab work and imaging with patient at bedside. Questions answered.   Most current vital signs reviewed and are as follows: BP (!) 143/80   Pulse 79   Temp 98.5 F (36.9 C) (Oral)   Resp 18   Ht 5\' 4"  (1.626 m)   Wt 67.1 kg   SpO2 100%   BMI 25.39  kg/m   Plan: Discharge to home.   Prescriptions written for: Lidoderm, meloxicam  Other home care instructions discussed: Encouraged to use cushioning such as a donut pillow continue over-the-counter Tylenol.  Will prescribe anti-inflammatory.  ED return instructions discussed: No worsening symptoms  Follow-up instructions discussed: Patient encouraged to follow-up with their PCP in 7 days.                                   Medical Decision Making Amount and/or  Complexity of Data Reviewed Radiology: ordered.  Risk Prescription drug management.   Patient with mechanical fall yesterday.  She has toe pain and lower back pain.  Today suggestive of possible coccygeal fracture.  Consistent with patient exam.  Will treat conservatively.        Final Clinical Impression(s) / ED Diagnoses Final diagnoses:  Fracture of coccyx, initial encounter for closed fracture (HCC)  Sprain of fifth toe of left foot, initial encounter    Rx / DC Orders ED Discharge Orders          Ordered    lidocaine (LIDODERM) 5 %  Every 24 hours        09/23/22 1156    meloxicam (MOBIC) 7.5 MG tablet  Daily        09/23/22 1156              Renne Crigler, PA-C 09/23/22 1201    Arby Barrette, MD 09/23/22 1622

## 2024-01-15 ENCOUNTER — Emergency Department (HOSPITAL_BASED_OUTPATIENT_CLINIC_OR_DEPARTMENT_OTHER)
Admission: EM | Admit: 2024-01-15 | Discharge: 2024-01-15 | Disposition: A | Discharge status: Home / Self Care | Attending: Emergency Medicine | Admitting: Emergency Medicine

## 2024-01-15 ENCOUNTER — Encounter (HOSPITAL_BASED_OUTPATIENT_CLINIC_OR_DEPARTMENT_OTHER): Payer: Self-pay | Admitting: Emergency Medicine

## 2024-01-15 ENCOUNTER — Other Ambulatory Visit: Payer: Self-pay

## 2024-01-15 DIAGNOSIS — K0889 Other specified disorders of teeth and supporting structures: Secondary | ICD-10-CM | POA: Diagnosis not present

## 2024-01-15 DIAGNOSIS — X58XXXA Exposure to other specified factors, initial encounter: Secondary | ICD-10-CM | POA: Insufficient documentation

## 2024-01-15 DIAGNOSIS — S00512A Abrasion of oral cavity, initial encounter: Secondary | ICD-10-CM | POA: Diagnosis not present

## 2024-01-15 DIAGNOSIS — S0993XA Unspecified injury of face, initial encounter: Secondary | ICD-10-CM | POA: Diagnosis present

## 2024-01-15 MED ORDER — LIDOCAINE VISCOUS HCL 2 % MT SOLN: 15.0000 mL | Freq: Four times a day (QID) | OROMUCOSAL | 1 refills | Status: AC | PRN

## 2024-01-15 MED ORDER — LIDOCAINE VISCOUS HCL 2 % MT SOLN
15.0000 mL | Freq: Once | OROMUCOSAL | Status: AC
  Administered 2024-01-15: 15 mL via OROMUCOSAL
  Filled 2024-01-15: qty 15

## 2024-01-15 NOTE — ED Triage Notes (Signed)
 Left lower toothache , broken tooth .

## 2024-01-15 NOTE — Discharge Instructions (Addendum)
 Please read and follow all provided instructions.  Your diagnoses today include:  1. Pain, dental    The exam and treatment you received today has been provided on an emergency basis only. This is not a substitute for complete medical or dental care.  Tests performed today include: Vital signs. See below for your results today.   Medications prescribed:  Viscous lidocaine : You may swish this in your mouth or apply a little bit to a cottonball and put over the painful area  Take any prescribed medications only as directed.  Home care instructions:  Follow any educational materials contained in this packet.  Consider using some wax to press onto the affected tooth to help protect your tongue.  Follow-up instructions: Please follow-up with your dentist for further evaluation of your symptoms.   Return instructions:  Please return to the Emergency Department if you experience worsening symptoms. Please return if you develop a fever, you develop more swelling in your face or neck, you have trouble breathing or swallowing food. Please return if you have any other emergent concerns.  Additional Information:  Your vital signs today were: BP 132/80 (BP Location: Right Arm)   Pulse 97   Temp 98.5 F (36.9 C) (Oral)   Resp 16   Wt 74.4 kg   SpO2 99%   BMI 28.15 kg/m  If your blood pressure (BP) was elevated above 135/85 this visit, please have this repeated by your doctor within one month. --------------

## 2024-01-15 NOTE — ED Provider Notes (Signed)
 " [place] [facility] Provider Note   CSN: [id number] Arrival date & time: 01/15/24  1131     Patient presents with: Dental Pain (Left lower )   [name] is a 59 y.o. female.   Patient presents to the emergency department today for evaluation of tongue pain and dental injury.  Patient states that left mandibular tooth broke a few days ago.  She has an area of the tooth that been rubbing against her tongue, causing discomfort.  No facial swelling, difficulty breathing or swallowing.  She applied some over-the-counter sealant for cavities without improvement.  She does have a dentist.       Prior to Admission medications  Medication Sig Start Date End Date Taking? Authorizing Provider  lidocaine  (XYLOCAINE ) 2 % solution Use as directed 15 mLs in the mouth or throat every 6 (six) hours as needed for mouth pain. 01/15/24  Yes Desiderio Chew, PA-C  Empagliflozin-metFORMIN HCl ER (SYNJARDY XR) 12.05-998 MG TB24 Take 1 tablet by mouth daily. 01/11/20   [provider]  levothyroxine  (SYNTHROID , LEVOTHROID) 100 MCG tablet Take 100 mcg by mouth daily.    [provider]  lidocaine  (LIDODERM ) 5 % Place 1 patch onto the skin daily. Remove & Discard patch within 12 hours or as directed by MD 09/23/22   Desiderio Chew, PA-C  losartan  (COZAAR ) 50 MG tablet Take 50 mg by mouth daily.    [provider]  meloxicam  (MOBIC ) 7.5 MG tablet Take 1 tablet (7.5 mg total) by mouth daily. 09/23/22   Aanvi Voyles, PA-C  tamsulosin  (FLOMAX ) 0.4 MG CAPS capsule Take 1 capsule (0.4 mg total) by mouth daily. 04/15/20   Odell Celinda Balo, MD  simvastatin (ZOCOR) 10 MG tablet Take 10 mg by mouth at bedtime.    12/09/12  [provider]  sitaGLIPtin (JANUVIA) 25 MG tablet Take 25 mg by mouth daily.    12/09/12  [provider]    Allergies: Aspirin, Codeine, Lisinopril, Penicillins, Diphenhydramine  hcl, and Moxifloxacin    Review of  Systems  Updated Vital Signs BP 132/80 (BP Location: Right Arm)   Pulse 97   Temp 98.5 F (36.9 C) (Oral)   Resp 16   Wt 74.4 kg   SpO2 99%   BMI 28.15 kg/m   Physical Exam Vitals and nursing note reviewed.  Constitutional:      Appearance: She is well-developed.  HENT:     Head: Normocephalic and atraumatic.     Jaw: No trismus.     Right Ear: Tympanic membrane, ear canal and external ear normal.     Left Ear: Tympanic membrane, ear canal and external ear normal.     Nose: Nose normal.     Mouth/Throat:     Dentition: No dental abscesses.     Pharynx: Uvula midline. No uvula swelling.     Tonsils: No tonsillar abscesses.     Comments: The left lateral portion of the tongue is slightly abraded, no deep laceration or bleeding.  Left mandibular tooth with some white sealant noted around the edges.  I do not feel any sharp edges. Eyes:     Conjunctiva/sclera: Conjunctivae normal.  Neck:     Comments: No neck swelling or Ludwig's angina Musculoskeletal:     Cervical back: Normal range of motion and neck supple.  Lymphadenopathy:     Cervical: No cervical adenopathy.  Skin:    General: Skin is warm and dry.  Neurological:  Mental Status: She is alert.     (all labs ordered are listed, but only abnormal results are displayed) Labs Reviewed - No data to display  EKG: None  Radiology: No results found.   Procedures   Medications Ordered in the ED  lidocaine  (XYLOCAINE ) 2 % viscous mouth solution 15 mL (15 mLs Mouth/Throat Given 01/15/24 1208)   ED Course  Patient seen and examined. History obtained directly from patient.  She was initially asking if I could file a tooth.  Discussed that this is not something we do in the emergency room.  I did check to see if he had anything which would be easily applied in our dental box, but did not find anything that would be unofficial.  She does have some wax to use at home in relation to her braces.  Recommended trying  this on the inside of the tooth still protect the tongue so she can follow-up.  Labs/EKG: None ordered.  Imaging: None ordered.  Medications/Fluids: Ordered: Viscous lidocaine   Most recent vital signs reviewed and are as follows: BP 132/80 (BP Location: Right Arm)   Pulse 97   Temp 98.5 F (36.9 C) (Oral)   Resp 16   Wt 74.4 kg   SpO2 99%   BMI 28.15 kg/m   Initial impression: dental pain/dental infection  Plan: Discharge to home.   Prescriptions written for: Viscous lidocaine   Other home care instructions discussed: Avoidance of chewing or other activities that makes the symptoms worse. Eat soft foods if needed and maintain good hydration.   ED return instructions discussed: Encouraged patient to return with worsening facial or neck swelling, difficulty breathing or swallowing, fever.   Follow-up instructions discussed: Patient encouraged to follow-up with their dentist.                                   Medical Decision Making Risk Prescription drug management.   Patient with a broken tooth which she sealed over with an over-the-counter product.  Patient reports some abrasion to the inside of her tongue which is causing her pain.  Recommended OTC meds, prescription for viscous lidocaine .  She will need dental attention.  No acute emergencies or infection suspected.     Final diagnoses:  Pain, dental  Abrasion of tongue, initial encounter    ED Discharge Orders          Ordered    lidocaine  (XYLOCAINE ) 2 % solution  Every 6 hours PRN        01/15/24 1210               Desiderio Chew, PA-C 01/15/24 1217    Cottie Donnice PARAS, MD 01/15/24 1218  "
